# Patient Record
Sex: Male | Born: 1970 | Race: Black or African American | Hispanic: No | Marital: Single | Smoking: Never smoker
Health system: Southern US, Community
[De-identification: ages and names within clinical notes are randomized; demographics above are authoritative.]

---

## 1998-10-15 ENCOUNTER — Emergency Department (HOSPITAL_COMMUNITY): Admission: EM | Admit: 1998-10-15 | Discharge: 1998-10-15 | Payer: Self-pay | Admitting: Emergency Medicine

## 2002-06-03 ENCOUNTER — Emergency Department (HOSPITAL_COMMUNITY): Admission: EM | Admit: 2002-06-03 | Discharge: 2002-06-03 | Payer: Self-pay | Admitting: Emergency Medicine

## 2002-06-19 ENCOUNTER — Emergency Department (HOSPITAL_COMMUNITY): Admission: EM | Admit: 2002-06-19 | Discharge: 2002-06-19 | Payer: Self-pay

## 2005-06-27 ENCOUNTER — Emergency Department (HOSPITAL_COMMUNITY): Admission: EM | Admit: 2005-06-27 | Discharge: 2005-06-27 | Payer: Self-pay | Admitting: Emergency Medicine

## 2009-07-23 ENCOUNTER — Emergency Department (HOSPITAL_COMMUNITY): Admission: EM | Admit: 2009-07-23 | Discharge: 2009-07-23 | Payer: Self-pay | Admitting: Family Medicine

## 2012-05-29 ENCOUNTER — Emergency Department (INDEPENDENT_AMBULATORY_CARE_PROVIDER_SITE_OTHER)
Admission: EM | Admit: 2012-05-29 | Discharge: 2012-05-29 | Disposition: A | Discharge status: Home / Self Care | Payer: Self-pay | Source: Home / Self Care | Attending: Emergency Medicine | Admitting: Emergency Medicine

## 2012-05-29 ENCOUNTER — Encounter (HOSPITAL_COMMUNITY): Payer: Self-pay | Admitting: *Deleted

## 2012-05-29 DIAGNOSIS — T148XXA Other injury of unspecified body region, initial encounter: Secondary | ICD-10-CM

## 2012-05-29 DIAGNOSIS — IMO0002 Reserved for concepts with insufficient information to code with codable children: Secondary | ICD-10-CM

## 2012-05-29 DIAGNOSIS — Z23 Encounter for immunization: Secondary | ICD-10-CM

## 2012-05-29 MED ORDER — BACITRACIN ZINC 500 UNIT/GM EX OINT: TOPICAL_OINTMENT | Freq: Two times a day (BID) | CUTANEOUS | Status: AC

## 2012-05-29 MED ORDER — IBUPROFEN 200 MG PO TABS: 400.0000 mg | ORAL_TABLET | Freq: Four times a day (QID) | ORAL | Status: AC | PRN

## 2012-05-29 MED ORDER — TETANUS-DIPHTH-ACELL PERTUSSIS 5-2.5-18.5 LF-MCG/0.5 IM SUSP
INTRAMUSCULAR | Status: AC
  Filled 2012-05-29: qty 0.5

## 2012-05-29 MED ORDER — TETANUS-DIPHTH-ACELL PERTUSSIS 5-2.5-18.5 LF-MCG/0.5 IM SUSP
0.5000 mL | Freq: Once | INTRAMUSCULAR | Status: AC
  Administered 2012-05-29: 0.5 mL via INTRAMUSCULAR

## 2012-05-29 NOTE — ED Provider Notes (Signed)
History     CSN: [id number]  Arrival date & time 05/29/12  1800   First MD Initiated Contact with Patient 05/29/12 1803      Chief Complaint  Patient presents with  . Extremity Laceration    (Consider location/radiation/quality/duration/timing/severity/associated sxs/prior treatment) HPI Comments: Patient presents urgent care, after having sustained a laceration to his left wrist when he was washing some dishes. Patient bled a bit but has stopped since then it's been approximately 2 hours since the event. Additionally he was washing broke and a fragment injured and hit his left wrist. Patient denies any numbness, tingling sensation or distal weakness to the site of the wound. Vision is also uncertain about the last time he got a tetanus shot.  The history is provided by the patient.    History reviewed. No pertinent past medical history.  History reviewed. No pertinent past surgical history.  History reviewed. No pertinent family history.  History  Substance Use Topics  . Smoking status: Never Smoker   . Smokeless tobacco: Not on file  . Alcohol Use: Yes      Review of Systems  Constitutional: Negative for activity change and appetite change.  Skin: Positive for wound.  Neurological: Negative for dizziness, weakness and numbness.    Allergies  Review of patient's allergies indicates no known allergies.  Home Medications   Current Outpatient Rx  Name Route Sig Dispense Refill  . BACITRACIN ZINC 500 UNIT/GM EX OINT Topical Apply topically 2 (two) times daily. 120 g 0  . IBUPROFEN 200 MG PO TABS Oral Take 2 tablets (400 mg total) by mouth every 6 (six) hours as needed for pain. 30 tablet 0    BP 155/100  Pulse 90  Temp 99.2 F (37.3 C) (Oral)  Resp 19  SpO2 98%  Physical Exam  Nursing note and vitals reviewed. Constitutional: He appears well-developed and well-nourished.  Musculoskeletal: He exhibits tenderness.       Left hand: He exhibits tenderness and  laceration. He exhibits normal range of motion, no bony tenderness, normal two-point discrimination, normal capillary refill, no deformity and no swelling. normal sensation noted. Decreased sensation is not present in the ulnar distribution, is not present in the medial redistribution and is not present in the radial distribution. Normal strength noted. He exhibits no finger abduction, no thumb/finger opposition and no wrist extension trouble.       Hands: Neurological: He is alert.  Skin: No erythema.    ED Course  LACERATION REPAIR Performed by: Brentton Wardlow Authorized by: Jimmie Molly Consent: Verbal consent obtained. Consent given by: patient Patient understanding: patient states understanding of the procedure being performed Patient identity confirmed: verbally with patient Body area: upper extremity Location details: left wrist Laceration length: 2 cm Tendon involvement: none Nerve involvement: none Vascular damage: no Local anesthetic: lidocaine 1% with epinephrine Anesthetic total: 5 ml Patient sedated: no Irrigation solution: saline Skin closure: 4-0 Prolene Technique: simple Approximation: close Approximation difficulty: simple Dressing: antibiotic ointment Patient tolerance: Patient tolerated the procedure well with no immediate complications.   (including critical care time)  Labs Reviewed - No data to display No results found.   1. Laceration       MDM  3 cm laceration to radial aspect of left wrist. On complicated post procedure no active bleeding patient with no neural vascular deficits distally or proximally to the laceration which was repaired with a combination of nonabsorbable sutures and Steri-Strips for  Linear superficial abrasions.  Jimmie Molly, MD 05/29/12 2009

## 2012-05-29 NOTE — ED Notes (Signed)
Laceration left wrist washing dishes glass broke onset approx one hour ago

## 2012-06-07 ENCOUNTER — Encounter (HOSPITAL_COMMUNITY): Payer: Self-pay | Admitting: Emergency Medicine

## 2012-06-07 ENCOUNTER — Emergency Department (INDEPENDENT_AMBULATORY_CARE_PROVIDER_SITE_OTHER)
Admission: EM | Admit: 2012-06-07 | Discharge: 2012-06-07 | Disposition: A | Discharge status: Home / Self Care | Payer: Self-pay | Source: Home / Self Care

## 2012-06-07 DIAGNOSIS — Z4802 Encounter for removal of sutures: Secondary | ICD-10-CM

## 2012-06-07 NOTE — ED Provider Notes (Signed)
Medical screening examination/treatment/procedure(s) were performed by non-physician practitioner and as supervising physician I was immediately available for consultation/collaboration.  Leslee Home, M.D.   Reuben Likes, MD 06/07/12 [phone]

## 2012-06-07 NOTE — ED Notes (Signed)
Pt was seen 05/29/12 at [facility], pt here for suture removal

## 2012-06-07 NOTE — ED Provider Notes (Signed)
History     CSN: [id number]  Arrival date & time 06/07/12  1132   None     Chief Complaint  Patient presents with  . Suture / Staple Removal    (Consider location/radiation/quality/duration/timing/severity/associated sxs/prior treatment) Patient is a 41 y.o. male presenting with suture removal. The history is provided by the patient.  Suture / Staple Removal  The sutures were placed 7 to 10 days ago. There has been no treatment since the wound repair. There has been no drainage from the wound. There is no redness present. There is no swelling present. The pain has no pain. He has no difficulty moving the affected extremity or digit.    History reviewed. No pertinent past medical history.  History reviewed. No pertinent past surgical history.  No family history on file.  History  Substance Use Topics  . Smoking status: Never Smoker   . Smokeless tobacco: Not on file  . Alcohol Use: Yes      Review of Systems  Constitutional: Negative for fever and chills.  Skin: Positive for wound.       Healing well    Allergies  Review of patient's allergies indicates no known allergies.  Home Medications   Current Outpatient Rx  Name Route Sig Dispense Refill  . BACITRACIN ZINC 500 UNIT/GM EX OINT Topical Apply topically 2 (two) times daily. 120 g 0  . IBUPROFEN 200 MG PO TABS Oral Take 2 tablets (400 mg total) by mouth every 6 (six) hours as needed for pain. 30 tablet 0    There were no vitals taken for this visit.  Physical Exam  Constitutional: He appears well-developed and well-nourished. No distress.  Skin: Skin is warm and dry.       2cm wound L anterior wrist healing well, 2 sutures present    ED Course  SUTURE REMOVAL Date/Time: 06/07/2012 12:45 PM Performed by: Cathlyn Parsons Authorized by: Cathlyn Parsons Consent: Verbal consent obtained. Consent given by: patient Patient understanding: patient states understanding of the procedure being performed Body  area: upper extremity Location details: left lower arm Wound Appearance: clean Sutures Removed: 2 Post-removal: Steri-Strips applied Facility: sutures placed in this facility Patient tolerance: Patient tolerated the procedure well with no immediate complications.   (including critical care time)  Labs Reviewed - No data to display No results found.   1. Visit for suture removal       MDM          Cathlyn Parsons, NP 06/07/12 1253

## 2016-08-18 ENCOUNTER — Ambulatory Visit (HOSPITAL_COMMUNITY)
Admission: EM | Admit: 2016-08-18 | Discharge: 2016-08-18 | Disposition: A | Discharge status: Home / Self Care | Payer: Self-pay | Attending: Family Medicine | Admitting: Family Medicine

## 2016-08-18 ENCOUNTER — Encounter (HOSPITAL_COMMUNITY): Payer: Self-pay | Admitting: Emergency Medicine

## 2016-08-18 DIAGNOSIS — Z23 Encounter for immunization: Secondary | ICD-10-CM

## 2016-08-18 DIAGNOSIS — T63444A Toxic effect of venom of bees, undetermined, initial encounter: Secondary | ICD-10-CM

## 2016-08-18 MED ORDER — TETANUS-DIPHTH-ACELL PERTUSSIS 5-2.5-18.5 LF-MCG/0.5 IM SUSP
0.5000 mL | Freq: Once | INTRAMUSCULAR | Status: AC
  Administered 2016-08-18: 0.5 mL via INTRAMUSCULAR

## 2016-08-18 MED ORDER — TETANUS-DIPHTH-ACELL PERTUSSIS 5-2.5-18.5 LF-MCG/0.5 IM SUSP
INTRAMUSCULAR | Status: AC
  Filled 2016-08-18: qty 0.5

## 2016-08-18 NOTE — ED Triage Notes (Signed)
[facility] a [facility] of possible bee sting to his forehead that occurred earlier today. The patient denied any pain but has appreciable edema to the face area.

## 2016-08-18 NOTE — ED Provider Notes (Signed)
CSN: [id number]     Arrival date & time 08/18/16  1730 History   First MD Initiated Contact with Patient 08/18/16 1923     Chief Complaint  Patient presents with  . Insect Bite   (Consider location/radiation/quality/duration/timing/severity/associated sxs/prior Treatment) HPI 45 y/o male stung by bee on forehead today. No reaction. No treatment. States he wants a tetanus shot.  History reviewed. No pertinent past medical history. History reviewed. No pertinent surgical history. History reviewed. No pertinent family history. Social History  Substance Use Topics  . Smoking status: Never Smoker  . Smokeless tobacco: Never Used  . Alcohol use Yes    Review of Systems  Denies: HEADACHE, NAUSEA, ABDOMINAL PAIN, CHEST PAIN, CONGESTION, DYSURIA, SHORTNESS OF BREATH  Allergies  Review of patient's allergies indicates no known allergies.  Home Medications   Prior to Admission medications   Not on File   Meds Ordered and Administered this Visit  Medications - No data to display  BP 149/96 (BP Location: Left Arm)   Pulse 78   Temp 97.7 F (36.5 C) (Oral)   Resp 12   SpO2 100%  No data found.   Physical Exam NURSES NOTES AND VITAL SIGNS REVIEWED. CONSTITUTIONAL: Well developed, well nourished, no acute distress HEENT: normocephalic, atraumatic, without swelling, redness  EYES: Conjunctiva normal NECK:normal ROM, supple, no adenopathy PULMONARY:No respiratory distress, normal effort ABDOMINAL: Soft, ND, NT BS+, No CVAT MUSCULOSKELETAL: Normal ROM of all extremities,  SKIN: warm and dry without rash PSYCHIATRIC: Mood and affect, behavior are normal  Urgent Care Course   Clinical Course    Procedures (including critical care time)  Labs Review Labs Reviewed - No data to display  Imaging Review No results found.   Visual Acuity Review  Right Eye Distance:   Left Eye Distance:   Bilateral Distance:    Right Eye Near:   Left Eye Near:    Bilateral Near:         Tetanus booster provided.  MDM   1. Bee sting, undetermined intent, initial encounter     Patient is reassured that there are no issues that require transfer to higher level of care at this time or additional tests. Patient is advised to continue home symptomatic treatment. Patient is advised that if there are new or worsening symptoms to attend the emergency department, contact primary care provider, or return to UC. Instructions of care provided discharged home in stable condition.    THIS NOTE WAS GENERATED USING A VOICE RECOGNITION SOFTWARE PROGRAM. ALL REASONABLE EFFORTS  WERE MADE TO PROOFREAD THIS DOCUMENT FOR ACCURACY.  I have verbally reviewed the discharge instructions with the patient. A printed AVS was given to the patient.  All questions were answered prior to discharge.      Tharon AquasFrank C Clent Damore, [place] 08/19/16 [phone]

## 2016-08-18 NOTE — Discharge Instructions (Signed)
Apply cold compresses to your forehead this will help with swelling.  Take benadryl which will also help with both itching and swelling.   Return if there are new or worsening of symptoms  If you have shortness of breath call 911

## 2018-05-10 ENCOUNTER — Other Ambulatory Visit: Payer: Self-pay

## 2018-05-10 ENCOUNTER — Encounter (HOSPITAL_COMMUNITY): Payer: Self-pay | Admitting: Emergency Medicine

## 2018-05-10 ENCOUNTER — Emergency Department (HOSPITAL_COMMUNITY)
Admission: EM | Admit: 2018-05-10 | Discharge: 2018-05-10 | Disposition: A | Discharge status: Home / Self Care | Payer: Self-pay | Attending: Emergency Medicine | Admitting: Emergency Medicine

## 2018-05-10 DIAGNOSIS — W208XXA Other cause of strike by thrown, projected or falling object, initial encounter: Secondary | ICD-10-CM | POA: Insufficient documentation

## 2018-05-10 DIAGNOSIS — Y9301 Activity, walking, marching and hiking: Secondary | ICD-10-CM | POA: Insufficient documentation

## 2018-05-10 DIAGNOSIS — S0990XA Unspecified injury of head, initial encounter: Secondary | ICD-10-CM | POA: Insufficient documentation

## 2018-05-10 DIAGNOSIS — Y929 Unspecified place or not applicable: Secondary | ICD-10-CM | POA: Insufficient documentation

## 2018-05-10 DIAGNOSIS — Y999 Unspecified external cause status: Secondary | ICD-10-CM | POA: Insufficient documentation

## 2018-05-10 NOTE — ED Triage Notes (Signed)
Patient to ED to make sure his head is okay after a dead tree branch fell and hit him. Patient states it fell on the anterior side of his forehead, no abrasion, swelling, or hematoma noted. Patient denies LOC. A&O x 4, PERRL.

## 2018-05-10 NOTE — Discharge Instructions (Signed)
Please read attached information. If you experience any new or worsening signs or symptoms please return to the emergency room for evaluation. Please follow-up with your primary care provider or specialist as discussed. Please use medication prescribed only as directed and discontinue taking if you have any concerning signs or symptoms.   °

## 2018-05-10 NOTE — ED Notes (Signed)
Patient verbalized understanding of discharge instructions and denies any further needs or questions at this time. VSS, patient ambulatory with steady gait.

## 2018-05-10 NOTE — ED Provider Notes (Signed)
[facility] EMERGENCY DEPARTMENT Provider Note   CSN: [id number] Arrival date & time: 05/10/18  2134     History   Chief Complaint Chief Complaint  Patient presents with  . Head Injury    HPI Charles Hicks is a 47 y.o. male.  HPI    47 year old male presents status post head injury.  Patient notes he was walking when a tree branch fell and hit him on top of his head.  He notes he was wearing his ball.  He denies any significant head pain, loss of consciousness, neck pain, or any other injuries.  Patient denies any complaints, no swelling to his head, no open wounds.  Patient is not on blood thinners.   History reviewed. No pertinent past medical history.  There are no active problems to display for this patient.   History reviewed. No pertinent surgical history.      Home Medications    Prior to Admission medications   Not on File    Family History No family history on file.  Social History Social History   Tobacco Use  . Smoking status: Never Smoker  . Smokeless tobacco: Never Used  Substance Use Topics  . Alcohol use: Yes  . Drug use: No     Allergies   Patient has no known allergies.   Review of Systems Review of Systems  All other systems reviewed and are negative.    Physical Exam Updated Vital Signs BP (!) 148/101 (BP Location: Right Arm)   Pulse 93   Temp 99.1 F (37.3 C) (Oral)   Resp 16   SpO2 98%   Physical Exam  Constitutional: He is oriented to person, place, and time. He appears well-developed and well-nourished.  HENT:  Head: Normocephalic and atraumatic.  Head is atraumatic no signs of trauma, nontender palpation throughout, no pain with axial compression no C or T-spine tenderness to palpation  Eyes: Pupils are equal, round, and reactive to light. Conjunctivae are normal. Right eye exhibits no discharge. Left eye exhibits no discharge. No scleral icterus.  Neck: Normal range of motion. No JVD  present. No tracheal deviation present.  Pulmonary/Chest: Effort normal. No stridor.  Neurological: He is alert and oriented to person, place, and time. He has normal strength. He is not disoriented. No cranial nerve deficit or sensory deficit. Coordination normal. GCS eye subscore is 4. GCS verbal subscore is 5. GCS motor subscore is 6.  Psychiatric: He has a normal mood and affect. His behavior is normal. Judgment and thought content normal.  Nursing note and vitals reviewed.    ED Treatments / Results  Labs (all labs ordered are listed, but only abnormal results are displayed) Labs Reviewed - No data to display  EKG None  Radiology No results found.  Procedures Procedures (including critical care time)  Medications Ordered in ED Medications - No data to display   Initial Impression / Assessment and Plan / ED Course  I have reviewed the triage vital signs and the nursing notes.  Pertinent labs & imaging results that were available during my care of the patient were reviewed by me and considered in my medical decision making (see chart for details).     Labs:   Imaging:  Consults:  Therapeutics:  Discharge Meds:   Assessment/Plan: 47 year old male presents status post head injury.  Patient denies any physical complaints presently, patient is not on anticoagulation, no neurological deficits, no signs of trauma.  I have very low suspicion for  acute intracranial abnormality.  No need for head CT at this time, patient given strict return precautions, he verbalized understanding and agreement to today's plan had no further questions or concerns.    Final Clinical Impressions(s) / ED Diagnoses   Final diagnoses:  Injury of head, initial encounter    ED Discharge Orders    None       Rosalio LoudHedges, Charles Papesh, PA-C 05/10/18 2157    Tilden Fossaees, Elizabeth, MD 05/11/18 1329

## 2021-02-10 ENCOUNTER — Emergency Department (HOSPITAL_COMMUNITY): Payer: Self-pay | Admitting: Certified Registered Nurse Anesthetist

## 2021-02-10 ENCOUNTER — Emergency Department (HOSPITAL_COMMUNITY): Payer: Self-pay

## 2021-02-10 ENCOUNTER — Encounter (HOSPITAL_COMMUNITY): Payer: Self-pay | Admitting: Emergency Medicine

## 2021-02-10 ENCOUNTER — Inpatient Hospital Stay (HOSPITAL_COMMUNITY)
Admission: EM | Admit: 2021-02-10 | Discharge: 2021-02-13 | DRG: 908 | Disposition: A | Discharge status: Home / Self Care | Payer: Self-pay | Attending: Orthopedic Surgery | Admitting: Orthopedic Surgery

## 2021-02-10 ENCOUNTER — Other Ambulatory Visit: Payer: Self-pay

## 2021-02-10 ENCOUNTER — Encounter (HOSPITAL_COMMUNITY)
Admission: EM | Disposition: A | Discharge status: Home / Self Care | Payer: Self-pay | Source: Home / Self Care | Attending: Orthopedic Surgery

## 2021-02-10 DIAGNOSIS — E559 Vitamin D deficiency, unspecified: Secondary | ICD-10-CM | POA: Diagnosis present

## 2021-02-10 DIAGNOSIS — D62 Acute posthemorrhagic anemia: Secondary | ICD-10-CM

## 2021-02-10 DIAGNOSIS — S85152A Other specified injury of anterior tibial artery, left leg, initial encounter: Secondary | ICD-10-CM

## 2021-02-10 DIAGNOSIS — S8252XB Displaced fracture of medial malleolus of left tibia, initial encounter for open fracture type I or II: Secondary | ICD-10-CM | POA: Diagnosis present

## 2021-02-10 DIAGNOSIS — Z7289 Other problems related to lifestyle: Secondary | ICD-10-CM

## 2021-02-10 DIAGNOSIS — S96092A Other injury of muscle and tendon of long flexor muscle of toe at ankle and foot level, left foot, initial encounter: Secondary | ICD-10-CM | POA: Diagnosis present

## 2021-02-10 DIAGNOSIS — S81812A Laceration without foreign body, left lower leg, initial encounter: Secondary | ICD-10-CM

## 2021-02-10 DIAGNOSIS — Z23 Encounter for immunization: Secondary | ICD-10-CM

## 2021-02-10 DIAGNOSIS — Z789 Other specified health status: Secondary | ICD-10-CM | POA: Diagnosis present

## 2021-02-10 DIAGNOSIS — S85162A Unspecified injury of posterior tibial artery, left leg, initial encounter: Secondary | ICD-10-CM | POA: Diagnosis present

## 2021-02-10 DIAGNOSIS — W293XXA Contact with powered garden and outdoor hand tools and machinery, initial encounter: Secondary | ICD-10-CM | POA: Diagnosis present

## 2021-02-10 DIAGNOSIS — S82892B Other fracture of left lower leg, initial encounter for open fracture type I or II: Secondary | ICD-10-CM | POA: Diagnosis present

## 2021-02-10 DIAGNOSIS — Z20822 Contact with and (suspected) exposure to covid-19: Secondary | ICD-10-CM | POA: Diagnosis present

## 2021-02-10 DIAGNOSIS — S85172A Laceration of posterior tibial artery, left leg, initial encounter: Principal | ICD-10-CM | POA: Diagnosis present

## 2021-02-10 DIAGNOSIS — T1490XA Injury, unspecified, initial encounter: Secondary | ICD-10-CM

## 2021-02-10 DIAGNOSIS — Y9389 Activity, other specified: Secondary | ICD-10-CM

## 2021-02-10 DIAGNOSIS — S81802A Unspecified open wound, left lower leg, initial encounter: Secondary | ICD-10-CM | POA: Diagnosis present

## 2021-02-10 DIAGNOSIS — E8889 Other specified metabolic disorders: Secondary | ICD-10-CM | POA: Diagnosis present

## 2021-02-10 DIAGNOSIS — S96822A Laceration of other specified muscles and tendons at ankle and foot level, left foot, initial encounter: Secondary | ICD-10-CM | POA: Diagnosis present

## 2021-02-10 DIAGNOSIS — Z9889 Other specified postprocedural states: Secondary | ICD-10-CM

## 2021-02-10 DIAGNOSIS — S8402XA Injury of tibial nerve at lower leg level, left leg, initial encounter: Secondary | ICD-10-CM | POA: Diagnosis present

## 2021-02-10 HISTORY — PX: I & D EXTREMITY: SHX5045

## 2021-02-10 LAB — COMPREHENSIVE METABOLIC PANEL
ALT: 23 U/L (ref 0–44)
AST: 42 U/L — ABNORMAL HIGH (ref 15–41)
Albumin: 3.8 g/dL (ref 3.5–5.0)
Alkaline Phosphatase: 36 U/L — ABNORMAL LOW (ref 38–126)
Anion gap: 11 (ref 5–15)
BUN: 15 mg/dL (ref 6–20)
CO2: 23 mmol/L (ref 22–32)
Calcium: 8.9 mg/dL (ref 8.9–10.3)
Chloride: 104 mmol/L (ref 98–111)
Creatinine, Ser: 1.12 mg/dL (ref 0.61–1.24)
GFR, Estimated: >60 mL/min (ref >60)
Glucose, Bld: 134 mg/dL — ABNORMAL HIGH (ref 70–99)
Potassium: 4.3 mmol/L (ref 3.5–5.1)
Sodium: 138 mmol/L (ref 135–145)
Total Bilirubin: 1.1 mg/dL (ref 0.3–1.2)

## 2021-02-10 LAB — I-STAT CHEM 8, ED
BUN: 19 mg/dL (ref 6–20)
Calcium, Ion: 1.1 mmol/L — ABNORMAL LOW (ref 1.15–1.40)
Chloride: 105 mmol/L (ref 98–111)
Creatinine, Ser: 1.2 mg/dL (ref 0.61–1.24)
Glucose, Bld: 133 mg/dL — ABNORMAL HIGH (ref 70–99)
HCT: 37 % — ABNORMAL LOW (ref 39.0–52.0)
Hemoglobin: 12.6 g/dL — ABNORMAL LOW (ref 13.0–17.0)
Potassium: 3.7 mmol/L (ref 3.5–5.1)
Sodium: 140 mmol/L (ref 135–145)
TCO2: 25 mmol/L (ref 22–32)

## 2021-02-10 LAB — CBC
HCT: 37.2 % — ABNORMAL LOW (ref 39.0–52.0)
Hemoglobin: 13 g/dL (ref 13.0–17.0)
MCH: 30.4 pg (ref 26.0–34.0)
MCHC: 34.9 g/dL (ref 30.0–36.0)
MCV: 87.1 fL (ref 80.0–100.0)
Platelets: 224 10*3/uL (ref 150–400)
RBC: 4.27 MIL/uL (ref 4.22–5.81)
RDW: 14.9 % (ref 11.5–15.5)
WBC: 6.7 10*3/uL (ref 4.0–10.5)
nRBC: 0 % (ref 0.0–0.2)

## 2021-02-10 LAB — TYPE AND SCREEN
ABO/RH(D): O POS
Antibody Screen: NEGATIVE

## 2021-02-10 LAB — PROTIME-INR
INR: 1.1 (ref 0.8–1.2)
Prothrombin Time: 13.3 seconds (ref 11.4–15.2)

## 2021-02-10 LAB — RESP PANEL BY RT-PCR (FLU A&B, COVID) ARPGX2
Influenza A by PCR: NEGATIVE
Influenza B by PCR: NEGATIVE
SARS Coronavirus 2 by RT PCR: NEGATIVE

## 2021-02-10 LAB — ABO/RH: ABO/RH(D): O POS

## 2021-02-10 LAB — ETHANOL: Alcohol, Ethyl (B): <10 mg/dL (ref <10)

## 2021-02-10 LAB — PROTEIN, TOTAL: Total Protein: 6.1 g/dL — ABNORMAL LOW (ref 6.5–8.1)

## 2021-02-10 LAB — LACTIC ACID, PLASMA: Lactic Acid, Venous: 2.6 mmol/L (ref 0.5–1.9)

## 2021-02-10 SURGERY — IRRIGATION AND DEBRIDEMENT EXTREMITY
Anesthesia: General | Laterality: Left

## 2021-02-10 MED ORDER — FENTANYL CITRATE (PF) 250 MCG/5ML IJ SOLN
INTRAMUSCULAR | Status: AC
  Filled 2021-02-10: qty 5

## 2021-02-10 MED ORDER — ACETAMINOPHEN 325 MG PO TABS: 325.0000 mg | ORAL_TABLET | Freq: Four times a day (QID) | ORAL | Status: DC | PRN

## 2021-02-10 MED ORDER — POTASSIUM CHLORIDE IN NACL 20-0.9 MEQ/L-% IV SOLN
INTRAVENOUS | Status: DC
  Filled 2021-02-10 (×2): qty 1000

## 2021-02-10 MED ORDER — SODIUM CHLORIDE 0.9 % IV SOLN
2.0000 g | Freq: Every day | INTRAVENOUS | Status: AC
Start: 2021-02-10 — End: 2021-02-13
  Administered 2021-02-11 – 2021-02-12 (×3): 2 g via INTRAVENOUS
  Filled 2021-02-10 (×3): qty 20

## 2021-02-10 MED ORDER — OXYCODONE HCL 5 MG PO TABS
10.0000 mg | ORAL_TABLET | ORAL | Status: DC | PRN
  Administered 2021-02-12: 15 mg via ORAL
  Filled 2021-02-10: qty 3

## 2021-02-10 MED ORDER — METOCLOPRAMIDE HCL 5 MG PO TABS
5.0000 mg | ORAL_TABLET | Freq: Three times a day (TID) | ORAL | Status: DC | PRN
  Administered 2021-02-13: 5 mg via ORAL
  Filled 2021-02-10: qty 1

## 2021-02-10 MED ORDER — ACETAMINOPHEN 500 MG PO TABS
1000.0000 mg | ORAL_TABLET | Freq: Three times a day (TID) | ORAL | Status: DC
  Administered 2021-02-11 – 2021-02-13 (×9): 1000 mg via ORAL
  Filled 2021-02-10 (×8): qty 2

## 2021-02-10 MED ORDER — PHENYLEPHRINE 40 MCG/ML (10ML) SYRINGE FOR IV PUSH (FOR BLOOD PRESSURE SUPPORT)
PREFILLED_SYRINGE | INTRAVENOUS | Status: AC
  Filled 2021-02-10: qty 10

## 2021-02-10 MED ORDER — FENTANYL CITRATE (PF) 100 MCG/2ML IJ SOLN
25.0000 ug | INTRAMUSCULAR | Status: DC | PRN
Start: 2021-02-10 — End: 2021-02-10

## 2021-02-10 MED ORDER — PHENYLEPHRINE 40 MCG/ML (10ML) SYRINGE FOR IV PUSH (FOR BLOOD PRESSURE SUPPORT)
PREFILLED_SYRINGE | INTRAVENOUS | Status: DC | PRN
  Administered 2021-02-10 (×2): 80 ug via INTRAVENOUS

## 2021-02-10 MED ORDER — PROPOFOL 10 MG/ML IV BOLUS
INTRAVENOUS | Status: AC
  Filled 2021-02-10: qty 20

## 2021-02-10 MED ORDER — ROCURONIUM BROMIDE 10 MG/ML (PF) SYRINGE
PREFILLED_SYRINGE | INTRAVENOUS | Status: DC | PRN
  Administered 2021-02-10: 40 mg via INTRAVENOUS

## 2021-02-10 MED ORDER — METOCLOPRAMIDE HCL 5 MG/ML IJ SOLN: 5.0000 mg | Freq: Three times a day (TID) | INTRAMUSCULAR | Status: DC | PRN

## 2021-02-10 MED ORDER — CEFAZOLIN SODIUM-DEXTROSE 2-4 GM/100ML-% IV SOLN
2.0000 g | Freq: Once | INTRAVENOUS | Status: AC
Start: 2021-02-10 — End: 2021-02-10
  Administered 2021-02-10: 2 g via INTRAVENOUS
  Filled 2021-02-10: qty 100

## 2021-02-10 MED ORDER — MIDAZOLAM HCL 2 MG/2ML IJ SOLN
INTRAMUSCULAR | Status: DC | PRN
  Administered 2021-02-10: 2 mg via INTRAVENOUS

## 2021-02-10 MED ORDER — THIAMINE HCL 100 MG/ML IJ SOLN
100.0000 mg | Freq: Every day | INTRAMUSCULAR | Status: DC
  Filled 2021-02-10: qty 2

## 2021-02-10 MED ORDER — ENOXAPARIN SODIUM 40 MG/0.4ML ~~LOC~~ SOLN
40.0000 mg | SUBCUTANEOUS | Status: DC
  Administered 2021-02-11 – 2021-02-13 (×3): 40 mg via SUBCUTANEOUS
  Filled 2021-02-10 (×3): qty 0.4

## 2021-02-10 MED ORDER — SUGAMMADEX SODIUM 200 MG/2ML IV SOLN
INTRAVENOUS | Status: DC | PRN
  Administered 2021-02-10: 200 mg via INTRAVENOUS

## 2021-02-10 MED ORDER — 0.9 % SODIUM CHLORIDE (POUR BTL) OPTIME
TOPICAL | Status: DC | PRN
  Administered 2021-02-10: 1000 mL

## 2021-02-10 MED ORDER — DOCUSATE SODIUM 100 MG PO CAPS
100.0000 mg | ORAL_CAPSULE | Freq: Two times a day (BID) | ORAL | Status: DC
  Administered 2021-02-11 – 2021-02-13 (×5): 100 mg via ORAL
  Filled 2021-02-10 (×5): qty 1

## 2021-02-10 MED ORDER — ALBUMIN HUMAN 5 % IV SOLN: INTRAVENOUS | Status: DC | PRN

## 2021-02-10 MED ORDER — POLYETHYLENE GLYCOL 3350 17 G PO PACK: 17.0000 g | PACK | Freq: Every day | ORAL | Status: DC | PRN

## 2021-02-10 MED ORDER — HYDROMORPHONE HCL 1 MG/ML IJ SOLN
0.5000 mg | INTRAMUSCULAR | Status: DC | PRN
  Filled 2021-02-10: qty 1

## 2021-02-10 MED ORDER — POVIDONE-IODINE 10 % EX SWAB
2.0000 | Freq: Once | CUTANEOUS | Status: DC
Start: 2021-02-10 — End: 2021-02-10

## 2021-02-10 MED ORDER — ONDANSETRON HCL 4 MG/2ML IJ SOLN: 4.0000 mg | Freq: Four times a day (QID) | INTRAMUSCULAR | Status: DC | PRN

## 2021-02-10 MED ORDER — LORAZEPAM 2 MG/ML IJ SOLN: 1.0000 mg | INTRAMUSCULAR | Status: DC | PRN

## 2021-02-10 MED ORDER — METHOCARBAMOL 500 MG PO TABS
750.0000 mg | ORAL_TABLET | Freq: Three times a day (TID) | ORAL | Status: DC
  Administered 2021-02-11 – 2021-02-13 (×9): 750 mg via ORAL
  Filled 2021-02-10 (×8): qty 2

## 2021-02-10 MED ORDER — LIDOCAINE 2% (20 MG/ML) 5 ML SYRINGE
INTRAMUSCULAR | Status: DC | PRN
  Administered 2021-02-10: 60 mg via INTRAVENOUS

## 2021-02-10 MED ORDER — FENTANYL CITRATE (PF) 100 MCG/2ML IJ SOLN
50.0000 ug | Freq: Once | INTRAMUSCULAR | Status: AC
  Administered 2021-02-10: 50 ug via INTRAVENOUS
  Filled 2021-02-10: qty 2

## 2021-02-10 MED ORDER — TETANUS-DIPHTH-ACELL PERTUSSIS 5-2.5-18.5 LF-MCG/0.5 IM SUSY
0.5000 mL | PREFILLED_SYRINGE | Freq: Once | INTRAMUSCULAR | Status: AC
  Administered 2021-02-10: 0.5 mL via INTRAMUSCULAR
  Filled 2021-02-10: qty 0.5

## 2021-02-10 MED ORDER — FOLIC ACID 1 MG PO TABS
1.0000 mg | ORAL_TABLET | Freq: Every day | ORAL | Status: DC
  Administered 2021-02-11 – 2021-02-13 (×3): 1 mg via ORAL
  Filled 2021-02-10 (×3): qty 1

## 2021-02-10 MED ORDER — LACTATED RINGERS IV SOLN: INTRAVENOUS | Status: DC | PRN

## 2021-02-10 MED ORDER — LORAZEPAM 2 MG/ML IJ SOLN: 0.0000 mg | Freq: Two times a day (BID) | INTRAMUSCULAR | Status: DC

## 2021-02-10 MED ORDER — ONDANSETRON HCL 4 MG/2ML IJ SOLN
INTRAMUSCULAR | Status: AC
  Filled 2021-02-10: qty 2

## 2021-02-10 MED ORDER — ROCURONIUM BROMIDE 10 MG/ML (PF) SYRINGE
PREFILLED_SYRINGE | INTRAVENOUS | Status: AC
  Filled 2021-02-10: qty 10

## 2021-02-10 MED ORDER — DEXMEDETOMIDINE (PRECEDEX) IN NS 20 MCG/5ML (4 MCG/ML) IV SYRINGE
PREFILLED_SYRINGE | INTRAVENOUS | Status: AC
  Filled 2021-02-10: qty 5

## 2021-02-10 MED ORDER — METHOCARBAMOL 1000 MG/10ML IJ SOLN: 500.0000 mg | Freq: Three times a day (TID) | INTRAMUSCULAR | Status: DC

## 2021-02-10 MED ORDER — OXYCODONE HCL 5 MG PO TABS
5.0000 mg | ORAL_TABLET | ORAL | Status: DC | PRN
  Administered 2021-02-11 – 2021-02-13 (×4): 10 mg via ORAL
  Filled 2021-02-10 (×4): qty 2

## 2021-02-10 MED ORDER — THIAMINE HCL 100 MG PO TABS
100.0000 mg | ORAL_TABLET | Freq: Every day | ORAL | Status: DC
  Administered 2021-02-11 – 2021-02-13 (×3): 100 mg via ORAL
  Filled 2021-02-10 (×3): qty 1

## 2021-02-10 MED ORDER — PROPOFOL 10 MG/ML IV BOLUS
INTRAVENOUS | Status: DC | PRN
  Administered 2021-02-10: 130 mg via INTRAVENOUS

## 2021-02-10 MED ORDER — CEFAZOLIN SODIUM-DEXTROSE 1-4 GM/50ML-% IV SOLN: 1.0000 g | INTRAVENOUS | Status: DC

## 2021-02-10 MED ORDER — SODIUM CHLORIDE 0.9 % IR SOLN
Status: DC | PRN
  Administered 2021-02-10 (×3): 3000 mL

## 2021-02-10 MED ORDER — MIDAZOLAM HCL 2 MG/2ML IJ SOLN
INTRAMUSCULAR | Status: AC
  Filled 2021-02-10: qty 2

## 2021-02-10 MED ORDER — CHLORHEXIDINE GLUCONATE 4 % EX LIQD: 60.0000 mL | Freq: Once | CUTANEOUS | Status: DC

## 2021-02-10 MED ORDER — DEXAMETHASONE SODIUM PHOSPHATE 10 MG/ML IJ SOLN
INTRAMUSCULAR | Status: DC | PRN
  Administered 2021-02-10: 10 mg via INTRAVENOUS

## 2021-02-10 MED ORDER — ONDANSETRON HCL 4 MG PO TABS: 4.0000 mg | ORAL_TABLET | Freq: Four times a day (QID) | ORAL | Status: DC | PRN

## 2021-02-10 MED ORDER — FENTANYL CITRATE (PF) 250 MCG/5ML IJ SOLN
INTRAMUSCULAR | Status: DC | PRN
  Administered 2021-02-10 (×3): 50 ug via INTRAVENOUS
  Administered 2021-02-10 (×2): 100 ug via INTRAVENOUS
  Administered 2021-02-10: 50 ug via INTRAVENOUS

## 2021-02-10 MED ORDER — CEFAZOLIN SODIUM-DEXTROSE 2-4 GM/100ML-% IV SOLN
2.0000 g | INTRAVENOUS | Status: AC
  Administered 2021-02-10: 2 g via INTRAVENOUS

## 2021-02-10 MED ORDER — ONDANSETRON HCL 4 MG/2ML IJ SOLN
INTRAMUSCULAR | Status: DC | PRN
  Administered 2021-02-10: 4 mg via INTRAVENOUS

## 2021-02-10 MED ORDER — LIDOCAINE 2% (20 MG/ML) 5 ML SYRINGE
INTRAMUSCULAR | Status: AC
  Filled 2021-02-10: qty 5

## 2021-02-10 MED ORDER — LORAZEPAM 1 MG PO TABS: 1.0000 mg | ORAL_TABLET | ORAL | Status: DC | PRN

## 2021-02-10 MED ORDER — LORAZEPAM 2 MG/ML IJ SOLN: 0.0000 mg | Freq: Four times a day (QID) | INTRAMUSCULAR | Status: AC

## 2021-02-10 MED ORDER — METRONIDAZOLE IN NACL 5-0.79 MG/ML-% IV SOLN
500.0000 mg | Freq: Three times a day (TID) | INTRAVENOUS | Status: DC
  Administered 2021-02-11 – 2021-02-13 (×8): 500 mg via INTRAVENOUS
  Filled 2021-02-10 (×8): qty 100

## 2021-02-10 MED ORDER — ADULT MULTIVITAMIN W/MINERALS CH
1.0000 | ORAL_TABLET | Freq: Every day | ORAL | Status: DC
  Administered 2021-02-11 – 2021-02-13 (×3): 1 via ORAL
  Filled 2021-02-10 (×3): qty 1

## 2021-02-10 MED ORDER — DEXAMETHASONE SODIUM PHOSPHATE 10 MG/ML IJ SOLN
INTRAMUSCULAR | Status: AC
  Filled 2021-02-10: qty 1

## 2021-02-10 MED ORDER — SUCCINYLCHOLINE CHLORIDE 200 MG/10ML IV SOSY
PREFILLED_SYRINGE | INTRAVENOUS | Status: DC | PRN
  Administered 2021-02-10: 100 mg via INTRAVENOUS

## 2021-02-10 SURGICAL SUPPLY — 61 items
BNDG COHESIVE 4X5 TAN STRL (GAUZE/BANDAGES/DRESSINGS) ×2 IMPLANT
BNDG ELASTIC 4X5.8 VLCR STR LF (GAUZE/BANDAGES/DRESSINGS) ×1 IMPLANT
BNDG ELASTIC 6X5.8 VLCR STR LF (GAUZE/BANDAGES/DRESSINGS) ×1 IMPLANT
BNDG GAUZE ELAST 4 BULKY (GAUZE/BANDAGES/DRESSINGS) ×4 IMPLANT
BRUSH SCRUB EZ PLAIN DRY (MISCELLANEOUS) ×4 IMPLANT
CANISTER WOUNDNEG PRESSURE 500 (CANNISTER) ×1 IMPLANT
COVER SURGICAL LIGHT HANDLE (MISCELLANEOUS) ×4 IMPLANT
COVER WAND RF STERILE (DRAPES) IMPLANT
CUFF TOURN SGL QUICK 34 (TOURNIQUET CUFF) ×2
CUFF TRNQT CYL 34X4.125X (TOURNIQUET CUFF) IMPLANT
DRAPE U-SHAPE 47X51 STRL (DRAPES) ×2 IMPLANT
DRESSING PREVENA PLUS CUSTOM (GAUZE/BANDAGES/DRESSINGS) IMPLANT
DRSG ADAPTIC 3X8 NADH LF (GAUZE/BANDAGES/DRESSINGS) ×2 IMPLANT
DRSG PREVENA PLUS CUSTOM (GAUZE/BANDAGES/DRESSINGS) ×2
ELECT REM PT RETURN 9FT ADLT (ELECTROSURGICAL)
ELECTRODE REM PT RTRN 9FT ADLT (ELECTROSURGICAL) IMPLANT
GAUZE SPONGE 4X4 12PLY STRL (GAUZE/BANDAGES/DRESSINGS) ×4 IMPLANT
GLOVE BIO SURGEON STRL SZ7.5 (GLOVE) ×3 IMPLANT
GLOVE BIO SURGEON STRL SZ8 (GLOVE) ×3 IMPLANT
GLOVE BIOGEL PI IND STRL 7.5 (GLOVE) ×1 IMPLANT
GLOVE BIOGEL PI IND STRL 9 (GLOVE) ×1 IMPLANT
GLOVE BIOGEL PI INDICATOR 7.5 (GLOVE) ×3
GLOVE BIOGEL PI INDICATOR 9 (GLOVE) ×1
GLOVE SRG 8 PF TXTR STRL LF DI (GLOVE) ×1 IMPLANT
GLOVE SURG UNDER POLY LF SZ8 (GLOVE) ×2
GOWN STRL REUS W/ TWL LRG LVL3 (GOWN DISPOSABLE) ×2 IMPLANT
GOWN STRL REUS W/ TWL XL LVL3 (GOWN DISPOSABLE) ×1 IMPLANT
GOWN STRL REUS W/TWL LRG LVL3 (GOWN DISPOSABLE) ×4
GOWN STRL REUS W/TWL XL LVL3 (GOWN DISPOSABLE) ×2
HANDPIECE INTERPULSE COAX TIP (DISPOSABLE) ×2
KIT BASIN OR (CUSTOM PROCEDURE TRAY) ×2 IMPLANT
KIT DRSG PREVENA PLUS 7DAY 125 (MISCELLANEOUS) ×1 IMPLANT
KIT TURNOVER KIT B (KITS) ×2 IMPLANT
MANIFOLD NEPTUNE II (INSTRUMENTS) ×2 IMPLANT
NS IRRIG 1000ML POUR BTL (IV SOLUTION) ×2 IMPLANT
PACK ORTHO EXTREMITY (CUSTOM PROCEDURE TRAY) ×2 IMPLANT
PAD ABD 8X10 STRL (GAUZE/BANDAGES/DRESSINGS) ×1 IMPLANT
PAD ARMBOARD 7.5X6 YLW CONV (MISCELLANEOUS) ×4 IMPLANT
PAD CAST 4YDX4 CTTN HI CHSV (CAST SUPPLIES) IMPLANT
PADDING CAST ABS 6INX4YD NS (CAST SUPPLIES) ×1
PADDING CAST ABS COTTON 6X4 NS (CAST SUPPLIES) IMPLANT
PADDING CAST COTTON 4X4 STRL (CAST SUPPLIES) ×2
PADDING CAST COTTON 6X4 STRL (CAST SUPPLIES) ×2 IMPLANT
SET HNDPC FAN SPRY TIP SCT (DISPOSABLE) IMPLANT
SOL PREP POV-IOD 4OZ 10% (MISCELLANEOUS) ×2 IMPLANT
SOL PREP PROV IODINE SCRUB 4OZ (MISCELLANEOUS) ×2 IMPLANT
SPONGE LAP 18X18 RF (DISPOSABLE) ×4 IMPLANT
STOCKINETTE IMPERVIOUS 9X36 MD (GAUZE/BANDAGES/DRESSINGS) IMPLANT
SUT PDS AB 1 CT  36 (SUTURE) ×2
SUT PDS AB 1 CT 36 (SUTURE) IMPLANT
SUT PDS AB 2-0 CT1 27 (SUTURE) IMPLANT
SUT PROLENE 0 CT 1 30 (SUTURE) ×1 IMPLANT
SUT PROLENE 2 0 SH DA (SUTURE) ×2 IMPLANT
SUT SILK 0 TIES 10X30 (SUTURE) ×1 IMPLANT
SUT SILK 1 TIES 10X30 (SUTURE) ×1 IMPLANT
TOWEL GREEN STERILE (TOWEL DISPOSABLE) ×4 IMPLANT
TOWEL GREEN STERILE FF (TOWEL DISPOSABLE) ×2 IMPLANT
TUBE CONNECTING 12X1/4 (SUCTIONS) ×2 IMPLANT
UNDERPAD 30X36 HEAVY ABSORB (UNDERPADS AND DIAPERS) ×2 IMPLANT
WATER STERILE IRR 1000ML POUR (IV SOLUTION) ×2 IMPLANT
YANKAUER SUCT BULB TIP NO VENT (SUCTIONS) ×2 IMPLANT

## 2021-02-10 NOTE — ED Notes (Signed)
Md wentz notified of pts lactic of 2.6

## 2021-02-10 NOTE — ED Provider Notes (Signed)
[facility] EMERGENCY DEPARTMENT Provider Note   CSN: [id number] Arrival date & time: 02/10/21  1542     History Chief Complaint  Patient presents with  . Laceration    Charles Hicks is a 50 y.o. male otherwise healthy no daily medication use.  Patient reports around 30 minutes prior to arrival he was cutting a fallen tree with a chainsaw, the chainsaw jumped and struck his left lower leg.  He describes immediate onset severe sharp pain nonradiating worsened with palpation is improved with rest/time.  Patient reports he saw bleeding immediately and attempted to use his jacket to stop the bleeding by tying a knot in it.  His friend then drove him to the emergency room for evaluation.  Patient's pain associated with a tingling sensation to the sole of his left foot.  Denies head injury, loss conscious, neck pain, chest pain, abdominal pain, pain of the upper extremities or right lower leg.   HPI     History reviewed. No pertinent past medical history.  There are no problems to display for this patient.   History reviewed. No pertinent surgical history.     History reviewed. No pertinent family history.  Social History   Tobacco Use  . Smoking status: Never Smoker  . Smokeless tobacco: Never Used  Substance Use Topics  . Alcohol use: Yes  . Drug use: No    Home Medications Prior to Admission medications   Not on File    Allergies    Patient has no known allergies.  Review of Systems   Review of Systems Ten systems are reviewed and are negative for acute change except as noted in the HPI  Physical Exam Updated Vital Signs BP 137/84   Pulse 92   Temp 98.3 F (36.8 C) (Oral)   Resp (!) 22   Ht 5\' 6"  (1.676 m)   Wt 74.8 kg   SpO2 100%   BMI 26.63 kg/m   Physical Exam Constitutional:      General: He is not in acute distress.    Appearance: Normal appearance. He is well-developed. He is not ill-appearing or diaphoretic.  HENT:      Head: Normocephalic and atraumatic.  Eyes:     General: Vision grossly intact. Gaze aligned appropriately.     Pupils: Pupils are equal, round, and reactive to light.  Neck:     Trachea: Trachea and phonation normal.  Cardiovascular:     Pulses:          Dorsalis pedis pulses are 2+ on the right side and 2+ on the left side.  Pulmonary:     Effort: Pulmonary effort is normal. No respiratory distress.  Abdominal:     General: There is no distension.     Palpations: Abdomen is soft.     Tenderness: There is no abdominal tenderness. There is no guarding or rebound.  Musculoskeletal:        General: Normal range of motion.     Cervical back: Normal range of motion.       Legs:     Comments: 2 gaping lacerations medial left ankle.  Proximal laceration approximately 9 cm in length, brisk bleeding present.  Tendon and muscle bodies apparent in wound.  Distal laceration overlying the medial malleolus, small chip appears to be present in the bone.  Feet:     Right foot:     Protective Sensation: 5 sites tested. 5 sites sensed.     Left foot:  Protective Sensation: 5 sites tested. 5 sites sensed.  Skin:    General: Skin is warm and dry.  Neurological:     Mental Status: He is alert.     GCS: GCS eye subscore is 4. GCS verbal subscore is 5. GCS motor subscore is 6.     Comments: Speech is clear and goal oriented, follows commands Major Cranial nerves without deficit, no facial droop Moves extremities without ataxia, coordination intact  Psychiatric:        Behavior: Behavior normal.     ED Results / Procedures / Treatments   Labs (all labs ordered are listed, but only abnormal results are displayed) Labs Reviewed  CBC - Abnormal; Notable for the following components:      Result Value   HCT 37.2 (*)    All other components within normal limits  I-STAT CHEM 8, ED - Abnormal; Notable for the following components:   Glucose, Bld 133 (*)    Calcium, Ion 1.10 (*)    Hemoglobin  12.6 (*)    HCT 37.0 (*)    All other components within normal limits  RESP PANEL BY RT-PCR (FLU A&B, COVID) ARPGX2  ETHANOL  PROTIME-INR  COMPREHENSIVE METABOLIC PANEL  URINALYSIS, ROUTINE W REFLEX MICROSCOPIC  LACTIC ACID, PLASMA  TYPE AND SCREEN  ABO/RH    EKG None  Radiology DG Ankle 2 Views Left  Result Date: 02/10/2021 CLINICAL DATA:  Trauma, chainsaw laceration to the LEFT ankle. EXAM: LEFT ANKLE - 2 VIEW COMPARISON:  None FINDINGS: Soft tissue defect in the medial aspect of the LEFT lower medial extremity overlying the distal tibia. Gas in the soft tissues compatible with reported history of posttraumatic soft tissue injury. No radiopaque foreign body. No signs of fracture. Lateral view shows thickening of the myotendinous junction of the Achilles and also shows gas in the soft tissues. Dressing in place over the site. On the lateral view posterior to the tibia at the site of injury there is a small calcific density approximately 3 mm. Bones and soft tissues are otherwise unremarkable. IMPRESSION: 1. Soft tissue defect with gas in the soft tissues compatible with reported history of posttraumatic soft tissue injury. 2. Question of small bone fragment versus is small foreign body projecting posterior to the tibia on the lateral view. 3. No sign of complete fracture or displacement of the tibia or fibula. 4. Thickening of the myotendinous junction/upper Achilles may simply be related to overlapping soft tissues and edema in the lower extremity. Correlate with site of injury to determine whether they may be risk for Achilles injury. Electronically Signed   By: Donzetta Kohut M.D.   On: 02/10/2021 16:55    Procedures .Critical Care Performed by: Bill Salinas, PA-C Authorized by: Bill Salinas, PA-C   Critical care provider statement:    Critical care time (minutes):  45   Critical care was necessary to treat or prevent imminent or life-threatening deterioration of the  following conditions:  Trauma (Leg trauma due to chainsaw possible limb threatening injury due to vascular involvement.)   Critical care was time spent personally by me on the following activities:  Discussions with consultants, evaluation of patient's response to treatment, examination of patient, ordering and performing treatments and interventions, ordering and review of laboratory studies, ordering and review of radiographic studies, pulse oximetry, re-evaluation of patient's condition, obtaining history from patient or surrogate, review of old charts and development of treatment plan with patient or surrogate     Medications Ordered  in ED Medications  fentaNYL (SUBLIMAZE) injection 50 mcg (50 mcg Intravenous Given 02/10/21 1612)  ceFAZolin (ANCEF) IVPB 2g/100 mL premix (2 g Intravenous New Bag/Given (Non-Interop) 02/10/21 1630)  Tdap (BOOSTRIX) injection 0.5 mL (0.5 mLs Intramuscular Given 02/10/21 1634)    ED Course  I have reviewed the triage vital signs and the nursing notes.  Pertinent labs & imaging results that were available during my care of the patient were reviewed by me and considered in my medical decision making (see chart for details).  Clinical Course as of 02/10/21 1755  Mon Feb 10, 2021  1634 Dr. Randie Heinz [BM]  1636 Dr. Carola Frost [BM]    Clinical Course User Index [BM] Elizabeth Palau   MDM Rules/Calculators/A&P                          Additional history obtained from: 1. Nursing notes from this visit. 2. Electronic medical records. ----------------- 50 year old male arrived with chainsaw injury of the left lower leg and ankle.  Patient had applied a tourniquet with his jacket prior to arrival.  Patient immediately evaluated, DP pulse intact along with sensation capillary refill to the toes.  He endorses some paresthesias to the sole of the foot.  Pain medication was given and patient's jacket was taken off revealing 2 large lacerations.  Large amount of muscle  body present along with vascular structures possibly posterior tibialis.  Patient also appears to have bony involvement of the medial malleolus.  Will obtain trauma labs, x-ray and consult orthopedist and vascular surgeon.  Tdap, pain medication and Ancef ordered.  Patient seen and evaluated by attending physician Dr. Effie Shy. - Patient was seen and evaluated by vascular surgery and orthopedic surgery and is being taken up to the operating room for repair.  Patient was reassessed resting comfortably no acute distress pain controlled following fentanyl.  He states understanding of care plan has no questions at this time vital signs remained stable.  I ordered, reviewed and interpreted labs which include: CBC without leukocytosis, anemia or thrombocytopenia. Covid/influenza panel negative. INR 1.1.  Ethanol negative. Type and screen O+, antibody negative.  DG Left Ankle:  IMPRESSION:  1. Soft tissue defect with gas in the soft tissues compatible with  reported history of posttraumatic soft tissue injury.  2. Question of small bone fragment versus is small foreign body  projecting posterior to the tibia on the lateral view.  3. No sign of complete fracture or displacement of the tibia or  fibula.  4. Thickening of the myotendinous junction/upper Achilles may simply  be related to overlapping soft tissues and edema in the lower  extremity. Correlate with site of injury to determine whether they  may be risk for Achilles injury.     Note: Portions of this report may have been transcribed using voice recognition software. Every effort was made to ensure accuracy; however, inadvertent computerized transcription errors may still be present.  Final Clinical Impression(s) / ED Diagnoses Final diagnoses:  Laceration of left lower extremity, initial encounter  Contact with chainsaw as cause of accidental injury    Rx / DC Orders ED Discharge Orders    None       Elizabeth Palau 02/10/21 1756    Mancel Bale, MD 02/11/21 1026

## 2021-02-10 NOTE — ED Provider Notes (Signed)
  Face-to-face evaluation   History: Patient presents after accidentally injuring his left ankle with a chainsaw, operating while he was cutting a tree that was down.  He presents by private vehicle.  Unknown tetanus status.  He denies other injuries.  He did not try walking after the injury.  Physical exam: Alert, calm, cooperative.  Left medial ankle, with 2 gaping lacerations.  More proximal laceration is about 8 cm in length, gaping about 3 and half centimeters.  There is a large clot at the inferior aspect of this wound.  When this clot was removed there was a visible tendon laceration, and a large blood vessel, with a defect in it, bleeding, with venous type pulsation.  No clear evidence for arterial bleed.  Tibia and macerated muscle tissue was seen at the base of the wound.  Inferior to the larger wound is a small wound about 3 and half centimeters in length, gaping 2 cm wide, with visible tibia at the base, which appears to have a defect in it from the chainsaw blade neurovascular intact distally in the left foot.  Medical screening examination/treatment/procedure(s) were conducted as a shared visit with non-physician practitioner(s) and myself.  I personally evaluated the patient during the encounter    Mancel Bale, MD 02/11/21 1026

## 2021-02-10 NOTE — ED Notes (Signed)
Informed consent obtained and at bedside.

## 2021-02-10 NOTE — H&P (Signed)
Orthopaedic Trauma Service (OTS) Consult/H&P  Patient ID: Charles Hicks MRN: [id number] DOB/AGE: 1970-12-27 49 y.o.   Reason for Consult: Complex wound left leg with exposed tendon due to chainsaw injury Referring Physician: Gray BernhardtElliot Wentz, MD (EDP)   HPI: Charles Hicks is an 50 y.o. male who sustained an accidental injury to his left leg while using a chainsaw.  Patient was cutting down a tree had completed this.  He was on the ground and went to set the chainsaw down fortunately the chain was still moving and he accidentally hit himself in the left ankle.  Patient had immediate onset of pain and a significant amount of blood present.  He was [facility] department for evaluation.  He was found to have an active arterial bleed pressure dressing was applied.  Vascular surgery was consulted and evaluated the patient at bedside.  Posterior tibial artery was lacerated.  At the bedside they were able to find the proximal stump of the artery and this was ligated with 3-0 silk suture.  Distal stump was not observed but there was no active bleeding from the distal stump.  Tendons were clearly visible along with a significant muscle belly injury.  Orthopedics consulted for additional management.  Patient seen and evaluated at the bedside simple dressing applied to the medial aspect of his left leg.  Dressing was removed he has a large irregular laceration to the mid distal third of his lower leg along with a wound over his medial malleolus.  He reports tingling and numbness to the plantar aspect of his foot.  Pain is moderate but not excruciating.  Denies any numbness or tingling to the dorsum or lateral aspect of his left foot.  Denies any additional injuries elsewhere.  No notable past medical history or surgical history Only takes over-the-counter allergy medications  Does not smoke, no other drugs Social drinker  Patient works in Aeronautical engineerlandscaping  Patient received  Ancef and Tdap in the ED  History reviewed. No pertinent past medical history.  History reviewed. No pertinent surgical history.  History reviewed. No pertinent family history.  Social History:  reports that he has never smoked. He has never used smokeless tobacco. He reports current alcohol use. He reports that he does not use drugs.  Allergies: No Known Allergies  Medications: I have reviewed the patient's current medications. No outpatient medications have been marked as taking for the 02/10/21 encounter [facility](Hospital Encounter).     Results for orders placed or performed during the hospital encounter of 02/10/21 (from the past 48 hour(s))  Resp Panel by RT-PCR (Flu A&B, Covid) Nasopharyngeal Swab     Status: None   Collection Time: 02/10/21  4:14 PM   Specimen: Nasopharyngeal Swab; Nasopharyngeal(NP) swabs in vial transport medium  Result Value Ref Range   SARS Coronavirus 2 by RT PCR NEGATIVE NEGATIVE    Comment: (NOTE) SARS-CoV-2 target nucleic acids are NOT DETECTED.  The SARS-CoV-2 RNA is generally detectable in upper respiratory specimens during the acute phase of infection. The lowest concentration of SARS-CoV-2 viral copies this assay can detect is 138 copies/mL. A negative result does not preclude SARS-Cov-2 infection and should not be used as the sole basis for treatment or other patient management decisions. A negative result may occur with  improper specimen collection/handling, submission of specimen other than nasopharyngeal swab, presence of viral mutation(s) within the areas targeted by this assay, and inadequate number of viral copies(<138 copies/mL). A negative  result must be combined with clinical observations, patient history, and epidemiological information. The expected result is Negative.  Fact Sheet for Patients:  BloggerCourse.com  Fact Sheet for Healthcare Providers:  SeriousBroker.it  This test is  no t yet approved or cleared by the [place] FDA and  has been authorized for detection and/or diagnosis of SARS-CoV-2 by FDA under an Emergency Use Authorization (EUA). This EUA will remain  in effect (meaning this test can be used) for the duration of the COVID-19 declaration under Section 564(b)(1) of the Act, 21 U.S.C.section 360bbb-3(b)(1), unless the authorization is terminated  or revoked sooner.       Influenza A by PCR NEGATIVE NEGATIVE   Influenza B by PCR NEGATIVE NEGATIVE    Comment: (NOTE) The Xpert Xpress SARS-CoV-2/FLU/RSV plus assay is intended as an aid in the diagnosis of influenza from Nasopharyngeal swab specimens and should not be used as a sole basis for treatment. Nasal washings and aspirates are unacceptable for Xpert Xpress SARS-CoV-2/FLU/RSV testing.  Fact Sheet for Patients: BloggerCourse.com  Fact Sheet for Healthcare Providers: SeriousBroker.it  This test is not yet approved or cleared by the [place] FDA and has been authorized for detection and/or diagnosis of SARS-CoV-2 by FDA under an Emergency Use Authorization (EUA). This EUA will remain in effect (meaning this test can be used) for the duration of the COVID-19 declaration under Section 564(b)(1) of the Act, 21 U.S.C. section 360bbb-3(b)(1), unless the authorization is terminated or revoked.  Performed at [facility], [address], [place], [place] [address]   CBC     Status: Abnormal   Collection Time: 02/10/21  4:14 PM  Result Value Ref Range   WBC 6.7 4.0 - 10.5 K/uL   RBC 4.27 4.22 - 5.81 MIL/uL   Hemoglobin 13.0 13.0 - 17.0 g/dL   HCT 62.3 (L) 76.2 - 83.1 %   MCV 87.1 80.0 - 100.0 fL   MCH 30.4 26.0 - 34.0 pg   MCHC 34.9 30.0 - 36.0 g/dL   RDW 51.7 61.6 - 07.3 %   Platelets 224 150 - 400 K/uL   nRBC 0.0 0.0 - 0.2 %    Comment: Performed at [facility], [address], [place], [place] [address]  Ethanol      Status: None   Collection Time: 02/10/21  4:14 PM  Result Value Ref Range   Alcohol, Ethyl (B) <10 <10 mg/dL    Comment: (NOTE) Lowest detectable limit for serum alcohol is 10 mg/dL.  For medical purposes only. Performed at [facility], [address] [place], [place] [address]   Protime-INR     Status: None   Collection Time: 02/10/21  4:14 PM  Result Value Ref Range   Prothrombin Time 13.3 11.4 - 15.2 seconds   INR 1.1 0.8 - 1.2    Comment: (NOTE) INR goal varies based on device and disease states. Performed at [facility], [address], [place], [place] [address]   Type and screen [facility]     Status: None   Collection Time: 02/10/21  4:15 PM  Result Value Ref Range   ABO/RH(D) O POS    Antibody Screen NEG    Sample Expiration      02/13/2021,2359 Performed at [facility], [address] [place], [place] [address]   I-Stat Chem 8, ED     Status: Abnormal   Collection Time: 02/10/21  4:40 PM  Result Value  Ref Range   Sodium 140 135 - 145 mmol/L   Potassium 3.7 3.5 - 5.1 mmol/L   Chloride 105 98 - 111 mmol/L   BUN 19 6 - 20 mg/dL   Creatinine, Ser 5.40 0.61 - 1.24 mg/dL   Glucose, Bld 086 (H) 70 - 99 mg/dL    Comment: Glucose reference range applies only to samples taken after fasting for at least 8 hours.   Calcium, Ion 1.10 (L) 1.15 - 1.40 mmol/L   TCO2 25 22 - 32 mmol/L   Hemoglobin 12.6 (L) 13.0 - 17.0 g/dL   HCT 76.1 (L) 95.0 - 93.2 %    DG Ankle 2 Views Left  Result Date: 02/10/2021 CLINICAL DATA:  Trauma, chainsaw laceration to the LEFT ankle. EXAM: LEFT ANKLE - 2 VIEW COMPARISON:  None FINDINGS: Soft tissue defect in the medial aspect of the LEFT lower medial extremity overlying the distal tibia. Gas in the soft tissues compatible with reported history of posttraumatic soft tissue injury. No radiopaque foreign body. No signs of fracture. Lateral view shows thickening of the myotendinous junction of the Achilles  and also shows gas in the soft tissues. Dressing in place over the site. On the lateral view posterior to the tibia at the site of injury there is a small calcific density approximately 3 mm. Bones and soft tissues are otherwise unremarkable. IMPRESSION: 1. Soft tissue defect with gas in the soft tissues compatible with reported history of posttraumatic soft tissue injury. 2. Question of small bone fragment versus is small foreign body projecting posterior to the tibia on the lateral view. 3. No sign of complete fracture or displacement of the tibia or fibula. 4. Thickening of the myotendinous junction/upper Achilles may simply be related to overlapping soft tissues and edema in the lower extremity. Correlate with site of injury to determine whether they may be risk for Achilles injury. Electronically Signed   By: Donzetta Kohut M.D.   On: 02/10/2021 16:55    Intake/Output    None      Review of Systems  Constitutional: Negative for chills and fever.  HENT: Negative for hearing loss.   Eyes: Negative for blurred vision and double vision.  Respiratory: Negative for shortness of breath and wheezing.   Cardiovascular: Negative for chest pain and palpitations.  Gastrointestinal: Negative for abdominal pain, nausea and vomiting.  Genitourinary: Negative for dysuria.  Musculoskeletal:       Left lower leg pain  Neurological: Positive for tingling (Tingling plantar aspect left foot) and sensory change (Numbness plantar aspect left foot). Negative for dizziness and headaches.  Endo/Heme/Allergies: Positive for environmental allergies.  Psychiatric/Behavioral: Negative for substance abuse.   Blood pressure 137/84, pulse 92, temperature 98.3 F (36.8 C), temperature source Oral, resp. rate (!) 22, height 5\' 6"  (1.676 m), weight 74.8 kg, SpO2 100 %. Physical Exam Vitals and nursing note reviewed. Exam conducted with a chaperone present.  Constitutional:      General: He is not in acute distress.     Appearance: Normal appearance. He is normal weight.  HENT:     Head: Normocephalic and atraumatic.     Mouth/Throat:     Mouth: Mucous membranes are moist.     Pharynx: Oropharynx is clear.  Eyes:     Extraocular Movements: Extraocular movements intact.  Cardiovascular:     Rate and Rhythm: Normal rate and regular rhythm.     Pulses: Normal pulses.     Heart sounds: Normal heart sounds. No murmur heard.  Pulmonary:     Effort: Pulmonary effort is normal. No respiratory distress.     Breath sounds: Normal breath sounds. No wheezing.  Abdominal:     General: Bowel sounds are normal.     Palpations: Abdomen is soft.  Musculoskeletal:     Cervical back: Normal range of motion. No tenderness.     Comments: Left lower extremity Traumatic wound to the mid distal portion of the medial left leg approximately 8 to 10 cm in length, edges are irregular.  Exposed muscle belly present tied off pulsatile artery noted no active bleeding noted.  Not appreciate any bleeding distally.  No active oozing.  I did not probe the wound I do not appreciate a significant amount of dirt or debris.  No clothing appreciated in the wound on gross inspection Approximately 3 cm wound noted over the medial malleolus.  Tendons are visible.  They do have a black appearance to them.  The retinaculum appears to be severed as well.  I do believe I am visualizing the posterior tibialis tendon as well as the flexor digitorum longus tendon.  I do not appreciate any other structures but did not probe deep.  Skin is quite friable over the medial malleolus.  Do not appreciate any obvious violation of the periosteum  SPN and TN sensory functions intact TN sensation nearly absent on the plantar aspect of his foot  Patient is able to flex and extend his ankle, inversion is weak.  Eversion is intact He can perform big toe extension as well as big toe flexion Very weak lesser toe flexion did Lesser toe extension is intact  + DP  pulse  Compartments are soft lower leg and the foot  Knee and hip are unremarkable   Neurological:     Mental Status: He is alert and oriented to person, place, and time.  Psychiatric:        Mood and Affect: Mood normal.        Behavior: Behavior normal.        Thought Content: Thought content normal.      Assessment/Plan:  50 year old male accidental chainsaw injury to left leg with complex wound including tendon and vascular injury  -Complex wound left lower leg with tendon and vascular involvement, no fractures identified  Posterior tibial artery was ligated with respect to the proximal limb at the bedside by vascular surgery.  We will explore in the OR to evaluate for the distal limb and cauterized.  Patient does appear to have injury to his posterior tibial nerve as well as his flexor digitorum longus.  Based off of the wounds in my evaluation I do feel that it is more of a muscle belly type injury and may not be repairable.  We will perform irrigation debridement of his wound closure.  It is possible that after appropriate debridement he lacks sufficient soft tissue we may need to get plastics involved.   Will place him on Rocephin postoperatively given the mechanism of his injury x 72 hours     His weightbearing status will be dependent on what is repaired in the OR.  If we do proceed with tendon repair will likely place him in a splint and have him nonweightbearing for about 4weeks.  If there are no structures repaired in terms of tendinous structures he will likely be WBAT in CAM     - Pain management:  Titrate accordingly - ABL anemia/Hemodynamics  Monitor - Medical issues   No chronic issues - DVT/PE  prophylaxis:  Will likely place on aspirin 325 mg twice daily for 4 weeks  - ID:   Has received Ancef  Rocephin x72 hours postoperatively  - FEN/GI prophylaxis/Foley/Lines:  Npo  - Dispo:  OR today for irrigation debridement and complex wounds left leg due to  chainsaw injury    Mearl Latin, PA-C [phone] (C) 02/10/2021, 6:00 PM  [facility] [address] [place] [address] [phone] [phone] (F)    After 5pm and on the weekends please log on to Amion, go to orthopaedics and the look under the [facility] Call for the provider(s) on call. You can also call our office at [phone] and then follow the prompts to be connected to the call team.

## 2021-02-10 NOTE — Consult Note (Addendum)
Hospital Consult    Reason for Consult:  Chainsaw injury to left leg Requesting Physician:  Dr.Wentz MRN #:  [id number]  History of Present Illness: This is a 50 y.o. male with no significant past medical history who presented to ED after a self inflicted injury to his left leg with a chainsaw that occurred while cutting down a tree. He had applied a tourniquet himself prior to arrival. He does have some pain in left leg but he can move all his toes and ankle. He does have some numbness bottom of left foot near heel, but otherwise reports normal sensation to rest of foot and leg. He denies any other injuries  History reviewed. No pertinent past medical history.  History reviewed. No pertinent surgical history.  No Known Allergies  Prior to Admission medications   Not on File    Social History   Socioeconomic History  . Marital status: Single    Spouse name: Not on file  . Number of children: Not on file  . Years of education: Not on file  . Highest education level: Not on file  Occupational History  . Not on file  Tobacco Use  . Smoking status: Never Smoker  . Smokeless tobacco: Never Used  Substance and Sexual Activity  . Alcohol use: Yes  . Drug use: No  . Sexual activity: Not on file  Other Topics Concern  . Not on file  Social History Narrative  . Not on file   Social Determinants of Health   Financial Resource Strain: Not on file  Food Insecurity: Not on file  Transportation Needs: Not on file  Physical Activity: Not on file  Stress: Not on file  Social Connections: Not on file  Intimate Partner Violence: Not on file     No family history on file.  ROS: Otherwise negative unless mentioned in HPI  Physical Examination  Vitals:   02/10/21 1615 02/10/21 1630  BP: 118/78 116/83  Pulse: 95 85  Resp: 19 19  Temp:    SpO2: 99% 100%   Body mass index is 26.63 kg/m.  General:  WDWN in NAD Gait: Not observed HENT: WNL, normocephalic Pulmonary:  normal non-labored breathing Cardiac: regular rate and rhythm Vascular Exam/Pulses: bilateral lower extremities well perfused and warm. Left DP pulse palpable. Left PT pulse not palpable.Motor and sensation intact with a little decreased sensation on the plantar heel region of left foot. On removal of compressive dressings there were two lacerations to the medial distal left leg, one proximal to the medial malleolus about 4 cm in length, the second overlying the medial malleolus approximately 3 cm in in length. There was visible lacerated tendon, macerated muscle and the posterior tibial artery was visible within the wound bed spurting blood of the distal aspect of the more superior laceration. The posterior tibial artery was clamped and subsequently suture ligated with 3-0 Silk suture. The rest of the left leg and foot were unremarkable Right foot unremarkable with palpable DP and PT pulses. Motor and sensation intact Musculoskeletal: no muscle wasting or atrophy  Neurologic: A&O X 3;  No focal weakness or paresthesias are detected; speech is fluent/normal Psychiatric:  The pt has Normal affect.   CBC    Component Value Date/Time   HGB 12.6 (L) 02/10/2021 1640   HCT 37.0 (L) 02/10/2021 1640    BMET    Component Value Date/Time   NA 140 02/10/2021 1640   K 3.7 02/10/2021 1640   CL 105 02/10/2021 1640  GLUCOSE 133 (H) 02/10/2021 1640   BUN 19 02/10/2021 1640   CREATININE 1.20 02/10/2021 1640    COAGS: No results found for: INR, PROTIME   Non-Invasive Vascular Imaging:   DG Ankle 2 Views Left: IMPRESSION: 1. Soft tissue defect with gas in the soft tissues compatible with reported history of posttraumatic soft tissue injury. 2. Question of small bone fragment versus is small foreign body projecting posterior to the tibia on the lateral view. 3. No sign of complete fracture or displacement of the tibia or fibula. 4. Thickening of the myotendinous junction/upper Achilles may simply  be related to overlapping soft tissues and edema in the lower extremity. Correlate with site of injury to determine whether they may be risk for Achilles injury.  Statin:  No. Beta Blocker:  No. Aspirin:  No. ACEI:  No. ARB:  No. CCB use:  No Other antiplatelets/anticoagulants:  No.    ASSESSMENT/PLAN: This is a 50 y.o. male with no significant past medical history who presented to ED after a self inflicted injury to his left leg with a chainsaw that occurred while cutting down a tree. In the ER his left foot was warm, well perfused with palpable DP pulse. No palpable Posterior tibial pulse was felt. Motor and sensation intact with a little decreased sensation on the plantar heel region of left foot. On removal of compressive dressings the posterior tibial artery was visible in the wound bed spurting blood. The posterior tibial artery was clamped and subsequently suture ligated with 5-0 Prolene. Further management by orthopedics for soft tissue and bony injuries. Vascular will remain available if needed  Dory Horn [facility] [phone] 02/10/2021  4:54 PM   I have independently interviewed and examined patient and agree with PA assessment and plan above.  As above.  Has bounding dorsalis pedis pulse after ligation of left lower extremity posterior tibial artery.  No further intervention necessary from vascular standpoint.  Brandon C. Randie Heinz, MD [facility] [place] Office: [phone] Pager: [phone]

## 2021-02-10 NOTE — Transfer of Care (Signed)
Immediate Anesthesia Transfer of Care Note  Patient: Charles Hicks  Procedure(s) Performed: IRRIGATION AND DEBRIDEMENT ANKLE (Left )  Patient Location: PACU  Anesthesia Type:General  Level of Consciousness: drowsy  Airway & Oxygen Therapy: Patient Spontanous Breathing and Patient connected to face mask oxygen  Post-op Assessment: Report given to RN and Post -op Vital signs reviewed and stable  Post vital signs: Reviewed and stable  Last Vitals:  Vitals Value Taken Time  BP    Temp    Pulse    Resp    SpO2      Last Pain:  Vitals:   02/10/21 1856  TempSrc:   PainSc: 5          Complications: No complications documented.

## 2021-02-10 NOTE — Brief Op Note (Signed)
#   [id number] 

## 2021-02-10 NOTE — ED Triage Notes (Signed)
Patient cutting a tree on the ground with a chainsaw. Cut tree and chainsaw went through tree and hit left foot two location. Self tourniquet prior to arrival. Pedal pulse +2 able to move all toes and states bottom of left foot is numb.

## 2021-02-10 NOTE — Progress Notes (Signed)
Received phone call from lab saying potassium has hemolyzed. Per Dr. Carola Frost not needed to be redrawn before surgery. No new orders received.

## 2021-02-10 NOTE — Anesthesia Preprocedure Evaluation (Signed)
Anesthesia Evaluation  Patient identified by MRN, date of birth, ID band Patient awake    Reviewed: Allergy & Precautions, NPO status , Patient's Chart, lab work & pertinent test results  Airway Mallampati: II  TM Distance: >3 FB Neck ROM: Full    Dental  (+) Dental Advisory Given   Pulmonary neg pulmonary ROS,    breath sounds clear to auscultation       Cardiovascular negative cardio ROS   Rhythm:Regular Rate:Normal     Neuro/Psych negative neurological ROS     GI/Hepatic negative GI ROS, Neg liver ROS,   Endo/Other  negative endocrine ROS  Renal/GU negative Renal ROS     Musculoskeletal   Abdominal   Peds  Hematology negative hematology ROS (+)   Anesthesia Other Findings   Reproductive/Obstetrics                             Anesthesia Physical Anesthesia Plan  ASA: II and emergent  Anesthesia Plan: General   Post-op Pain Management:    Induction: Intravenous and Rapid sequence  PONV Risk Score and Plan: 2 and Ondansetron, Dexamethasone and Treatment may vary due to age or medical condition  Airway Management Planned: Oral ETT  Additional Equipment: None  Intra-op Plan:   Post-operative Plan: Extubation in OR  Informed Consent: I have reviewed the patients History and Physical, chart, labs and discussed the procedure including the risks, benefits and alternatives for the proposed anesthesia with the patient or authorized representative who has indicated his/her understanding and acceptance.     Dental advisory given  Plan Discussed with: CRNA  Anesthesia Plan Comments:         Anesthesia Quick Evaluation

## 2021-02-10 NOTE — Anesthesia Procedure Notes (Signed)
Procedure Name: Intubation Date/Time: 02/10/2021 8:18 PM Performed by: Molli Hazard, CRNA Pre-anesthesia Checklist: Patient identified, Emergency Drugs available, Suction available and Patient being monitored Patient Re-evaluated:Patient Re-evaluated prior to induction Oxygen Delivery Method: Circle system utilized Preoxygenation: Pre-oxygenation with 100% oxygen Induction Type: IV induction, Rapid sequence and Cricoid Pressure applied Laryngoscope Size: Miller and 2 Grade View: Grade I Tube type: Oral Tube size: 7.5 mm Number of attempts: 1 Airway Equipment and Method: Stylet Placement Confirmation: ETT inserted through vocal cords under direct vision,  positive ETCO2 and breath sounds checked- equal and bilateral Secured at: 23 cm Tube secured with: Tape Dental Injury: Teeth and Oropharynx as per pre-operative assessment

## 2021-02-11 ENCOUNTER — Encounter (HOSPITAL_COMMUNITY): Payer: Self-pay | Admitting: Orthopedic Surgery

## 2021-02-11 LAB — COMPREHENSIVE METABOLIC PANEL
ALT: 19 U/L (ref 0–44)
AST: 28 U/L (ref 15–41)
Albumin: 3.5 g/dL (ref 3.5–5.0)
Alkaline Phosphatase: 24 U/L — ABNORMAL LOW (ref 38–126)
Anion gap: 7 (ref 5–15)
BUN: 8 mg/dL (ref 6–20)
CO2: 26 mmol/L (ref 22–32)
Calcium: 8.3 mg/dL — ABNORMAL LOW (ref 8.9–10.3)
Chloride: 104 mmol/L (ref 98–111)
Creatinine, Ser: 0.88 mg/dL (ref 0.61–1.24)
GFR, Estimated: >60 mL/min (ref >60)
Glucose, Bld: 155 mg/dL — ABNORMAL HIGH (ref 70–99)
Potassium: 4.2 mmol/L (ref 3.5–5.1)
Sodium: 137 mmol/L (ref 135–145)
Total Bilirubin: 0.9 mg/dL (ref 0.3–1.2)
Total Protein: 5.9 g/dL — ABNORMAL LOW (ref 6.5–8.1)

## 2021-02-11 LAB — URINALYSIS, ROUTINE W REFLEX MICROSCOPIC
Bilirubin Urine: NEGATIVE
Glucose, UA: NEGATIVE mg/dL
Hgb urine dipstick: NEGATIVE
Ketones, ur: NEGATIVE mg/dL
Leukocytes,Ua: NEGATIVE
Nitrite: NEGATIVE
Protein, ur: NEGATIVE mg/dL
Specific Gravity, Urine: 1.011 (ref 1.005–1.030)
pH: 5 (ref 5.0–8.0)

## 2021-02-11 LAB — CBC
HCT: 27.1 % — ABNORMAL LOW (ref 39.0–52.0)
Hemoglobin: 9.2 g/dL — ABNORMAL LOW (ref 13.0–17.0)
MCH: 30 pg (ref 26.0–34.0)
MCHC: 33.9 g/dL (ref 30.0–36.0)
MCV: 88.3 fL (ref 80.0–100.0)
Platelets: 156 10*3/uL (ref 150–400)
RBC: 3.07 MIL/uL — ABNORMAL LOW (ref 4.22–5.81)
RDW: 15 % (ref 11.5–15.5)
WBC: 10.1 10*3/uL (ref 4.0–10.5)
nRBC: 0 % (ref 0.0–0.2)

## 2021-02-11 LAB — VITAMIN D 25 HYDROXY (VIT D DEFICIENCY, FRACTURES): Vit D, 25-Hydroxy: 12.81 ng/mL — ABNORMAL LOW (ref 30–100)

## 2021-02-11 LAB — MAGNESIUM: Magnesium: 1.5 mg/dL — ABNORMAL LOW (ref 1.7–2.4)

## 2021-02-11 LAB — PHOSPHORUS: Phosphorus: 3.1 mg/dL (ref 2.5–4.6)

## 2021-02-11 MED ORDER — MAGNESIUM OXIDE 400 (241.3 MG) MG PO TABS
400.0000 mg | ORAL_TABLET | Freq: Two times a day (BID) | ORAL | Status: AC
  Administered 2021-02-11 (×2): 400 mg via ORAL
  Filled 2021-02-11 (×2): qty 1

## 2021-02-11 MED ORDER — ASCORBIC ACID 500 MG PO TABS
500.0000 mg | ORAL_TABLET | Freq: Every day | ORAL | Status: DC
  Administered 2021-02-11 – 2021-02-13 (×3): 500 mg via ORAL
  Filled 2021-02-11 (×3): qty 1

## 2021-02-11 MED ORDER — VITAMIN D 25 MCG (1000 UNIT) PO TABS
2000.0000 [IU] | ORAL_TABLET | Freq: Two times a day (BID) | ORAL | Status: DC
  Administered 2021-02-11 – 2021-02-13 (×5): 2000 [IU] via ORAL
  Filled 2021-02-11 (×5): qty 2

## 2021-02-11 NOTE — Evaluation (Signed)
Occupational Therapy Evaluation Patient Details Name: Charles Hicks MRN: [id number] DOB: 1971-04-26 Today's Date: 02/11/2021    History of Present Illness Ramell Wacha is 50 year old male who sustained an accidental chainsaw injury to left distal medial leg/ankle with complex wound including tendon, nerve and vascular injury. Pt appears to have injury to his posterior tibial nerve as well as his flexor digitorum longus, posterior tibial artery was ligated at bedside by vascular surgery. Pt underwent and I&D of L ankle and was placed in a splint on 3/21 and will be NWBx4 weeks. PMH: unremarkable.   Clinical Impression   Pt presents with decline in function and safety with ADLs and ADL mobility with impaired balance and endurance. PTA, pt lived at home withhis wife and adult son, was Ind with ADLs, mobility, was driving and working full time. Pt currently requires min guard A with LB ADLs, ADLs standing at sink and mobility using RW. Pt able to maintain L EL NWB. Pt would benefit from acute OT services to address impairments to maximize level of function and safety    Follow Up Recommendations  No OT follow up;Supervision - Intermittent    Equipment Recommendations  Other (comment) (reacher)    Recommendations for Other Services       Precautions / Restrictions Precautions Precautions: Fall Precaution Comments: L LE wound vac, splint with ace wrap Restrictions Weight Bearing Restrictions: Yes LLE Weight Bearing: Non weight bearing      Mobility Bed Mobility   Bed Mobility: Supine to Sit           General bed mobility comments: pt in recliner upon arrival, up earlier with PT    Transfers Overall transfer level: Needs assistance Equipment used: Rolling walker (2 wheeled) Transfers: Sit to/from Stand Sit to Stand: Min guard         General transfer comment: min guard for safety, verbal cues for hand placement, good technique    Balance Overall balance  assessment: Needs assistance Sitting-balance support: Feet supported;No upper extremity supported Sitting balance-Leahy Scale: Good     Standing balance support: Bilateral upper extremity supported;During functional activity Standing balance-Leahy Scale: Poor                             ADL either performed or assessed with clinical judgement   ADL Overall ADL's : Needs assistance/impaired Eating/Feeding: Independent;Sitting   Grooming: Wash/dry hands;Wash/dry face;Oral care;Min guard;Standing   Upper Body Bathing: Set up;Sitting   Lower Body Bathing: Min guard;Sitting/lateral leans;Sit to/from stand   Upper Body Dressing : Set up;Sitting   Lower Body Dressing: Min guard;Sitting/lateral leans;Sit to/from stand   Toilet Transfer: Min guard;Ambulation;RW;Comfort height toilet;Grab bars;Cueing for safety   Toileting- Clothing Manipulation and Hygiene: Min guard;Sit to/from stand       Functional mobility during ADLs: Min guard;Rolling walker;Cueing for safety General ADL Comments: pt educated on compensatory LB ADL techniques     Vision Patient Visual Report: No change from baseline       Perception     Praxis      Pertinent Vitals/Pain Pain Assessment: 0-10 Pain Score: 2  Pain Descriptors / Indicators: Numbness Pain Intervention(s): Monitored during session;Repositioned     Hand Dominance Right   Extremity/Trunk Assessment Upper Extremity Assessment Upper Extremity Assessment: Overall WFL for tasks assessed   Lower Extremity Assessment Lower Extremity Assessment: Defer to PT evaluation   Cervical / Trunk Assessment Cervical / Trunk Assessment: Normal   Communication Communication  Communication: No difficulties   Cognition Arousal/Alertness: Awake/alert Behavior During Therapy: WFL for tasks assessed/performed Overall Cognitive Status: Within Functional Limits for tasks assessed                                     General  Comments       Exercises     Shoulder Instructions      Home Living Family/patient expects to be discharged to:: Private residence Living Arrangements: Spouse/significant other;Children Available Help at Discharge: Family;Available 24 hours/day Type of Home: House   Entrance Stairs-Number of Steps: 5 Entrance Stairs-Rails: Can reach both Home Layout: One level     Bathroom Shower/Tub: Chief Strategy Officer: Handicapped height     Home Equipment: Environmental consultant - 2 wheels          Prior Functioning/Environment Level of Independence: Independent        Comments: works in Product/process development scientist Problem List: Impaired balance (sitting and/or standing);Pain;Decreased coordination;Decreased activity tolerance;Decreased knowledge of use of DME or AE      OT Treatment/Interventions: DME and/or AE instruction;Self-care/ADL training;Therapeutic activities;Balance training;Patient/family education    OT Goals(Current goals can be found in the care plan section) Acute Rehab OT Goals Patient Stated Goal: go home OT Goal Formulation: With patient Time For Goal Achievement: 02/25/21 Potential to Achieve Goals: Good ADL Goals Pt Will Perform Grooming: with supervision;with set-up;with modified independence;standing Pt Will Perform Lower Body Bathing: with supervision;with set-up;with modified independence;sitting/lateral leans;sit to/from stand Pt Will Perform Lower Body Dressing: with supervision;with set-up;with modified independence;sitting/lateral leans;sit to/from stand Pt Will Transfer to Toilet: with supervision;with modified independence;ambulating Pt Will Perform Toileting - Clothing Manipulation and hygiene: with supervision;with modified independence;sit to/from stand  OT Frequency: Min 2X/week   Barriers to D/C:            Co-evaluation              AM-PAC OT "6 Clicks" Daily Activity     Outcome Measure Help from another person eating meals?:  None Help from another person taking care of personal grooming?: A Little Help from another person toileting, which includes using toliet, bedpan, or urinal?: A Little Help from another person bathing (including washing, rinsing, drying)?: A Little Help from another person to put on and taking off regular upper body clothing?: None Help from another person to put on and taking off regular lower body clothing?: A Little 6 Click Score: 20   End of Session Equipment Utilized During Treatment: Gait belt;Rolling walker  Activity Tolerance: Patient tolerated treatment well Patient left: in chair;with call bell/phone within reach;with chair alarm set  OT Visit Diagnosis: Unsteadiness on feet (R26.81);Other abnormalities of gait and mobility (R26.89);Pain Pain - Right/Left: Left Pain - part of body: Leg;Ankle and joints of foot                Time: 0539-7673 OT Time Calculation (min): 24 min Charges:  OT General Charges $OT Visit: 1 Visit OT Evaluation $OT Eval Moderate Complexity: 1 Mod OT Treatments $Self Care/Home Management : 8-22 mins   Galen Manila 02/11/2021, 2:09 PM

## 2021-02-11 NOTE — Plan of Care (Signed)
Pt is alert and oriented x 4, sitting up in the chair, denies any pain, wound vac in place, dressing clean/dry/intact, no drainage noted.  Problem: Clinical Measurements: Goal: Will remain free from infection Outcome: Progressing   Problem: Pain Managment: Goal: General experience of comfort will improve Outcome: Progressing   Problem: Safety: Goal: Ability to remain free from injury will improve Outcome: Progressing   Problem: Skin Integrity: Goal: Risk for impaired skin integrity will decrease Outcome: Progressing

## 2021-02-11 NOTE — Progress Notes (Signed)
Pt arrived from PACU via bed. 2 RN skin assessment completed with charge nurse Winfield Rast. VSS at this time. Pt does not complain of any pain. Wound vac dressing is clean/dry/intact. KCI wound vac plugged into wall, continuous at 125. Pt educated on how to use IS. Call bell within reach. SCDs in place. Bed is low and wheels locked. Will continue to monitor.

## 2021-02-11 NOTE — Evaluation (Signed)
Physical Therapy Evaluation Patient Details Name: Charles Hicks MRN: [id number] DOB: 28-Dec-1970 Today's Date: 02/11/2021   History of Present Illness  Charles Hicks is 50 year old male who sustained an accidental chainsaw injury to left distal medial leg/ankle with complex wound including tendon, nerve and vascular injury. Pt appears to have injury to his posterior tibial nerve as well as his flexor digitorum longus, posterior tibial artery was ligated at bedside by vascular surgery. Pt underwent and I&D of L ankle and was placed in a splint on 3/21 and will be NWBx4 weeks. PMH: unremarkable.    Clinical Impression  Pt admitted with above. Pt tolerating mobility well and able to maintain L LE NWB. Pt denies pain but reports numbness. Anticipate pt to progress well and be safe to d/c home with family once medically stable. Pt will need RW until L LE WBing status advanced. Pt will need to complete 5 step stair negotiation prior to d/c. Acute PT to cont to follow.    Follow Up Recommendations No PT follow up;Supervision/Assistance - 24 hour (will likely need outpt PT for L foot once cleared by MD due to nerve injury)    Equipment Recommendations  Rolling walker with 5" wheels    Recommendations for Other Services       Precautions / Restrictions Precautions Precautions: Fall Precaution Comments: L LE wound vac, splint with ace wrap Restrictions Weight Bearing Restrictions: Yes LLE Weight Bearing: Non weight bearing      Mobility  Bed Mobility Overal bed mobility: Needs Assistance Bed Mobility: Supine to Sit     Supine to sit: Supervision     General bed mobility comments: assist for wound vac, HOB elevated, no physical assist for LE or trunk elevation    Transfers Overall transfer level: Needs assistance Equipment used: Rolling walker (2 wheeled) Transfers: Sit to/from Stand Sit to Stand: Min guard         General transfer comment: min guard for safety, verbal cues  for hand placement, good technique  Ambulation/Gait Ambulation/Gait assistance: Min guard Gait Distance (Feet): 50 Feet Assistive device: Rolling walker (2 wheeled) Gait Pattern/deviations: Step-to pattern Gait velocity: dec Gait velocity interpretation: <1.31 ft/sec, indicative of household ambulator General Gait Details: pt with hop to pattern, denies throbbing, able to maintain L LE NWB, good walker management  Stairs            Wheelchair Mobility    Modified Rankin (Stroke Patients Only)       Balance Overall balance assessment: Needs assistance Sitting-balance support: Feet supported;No upper extremity supported Sitting balance-Leahy Scale: Good     Standing balance support: Bilateral upper extremity supported Standing balance-Leahy Scale: Poor Standing balance comment: had to lean on sink via elbows to wash hands, stood to urinate with L UE support on walker, pt able to maintain L LE NWB                             Pertinent Vitals/Pain Pain Assessment: 0-10 Pain Score: 0-No pain Pain Descriptors / Indicators: Numbness Pain Intervention(s): Monitored during session    Home Living Family/patient expects to be discharged to:: Private residence Living Arrangements: Spouse/significant other;Children Available Help at Discharge: Family;Available 24 hours/day (between spouse and kids) Type of Home: House Home Access: Stairs to enter Entrance Stairs-Rails: Can reach both Entrance Stairs-Number of Steps: 5 Home Layout: One level Home Equipment: Crutches      Prior Function Level of Independence: Independent  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)   Dominant Hand: Right    Extremity/Trunk Assessment   Upper Extremity Assessment Upper Extremity Assessment: Overall WFL for tasks assessed    Lower Extremity Assessment Lower Extremity Assessment: LLE deficits/detail LLE Deficits / Details: L ankle in splint, able to  initiate wiggling toes, can do SLR and move L LE around in bed without difficulty LLE Sensation: WNL;decreased light touch    Cervical / Trunk Assessment Cervical / Trunk Assessment: Normal  Communication   Communication: No difficulties  Cognition Arousal/Alertness: Awake/alert Behavior During Therapy: WFL for tasks assessed/performed Overall Cognitive Status: Within Functional Limits for tasks assessed                                        General Comments General comments (skin integrity, edema, etc.): pt with L LE wound vac, splint and ace wrap in place    Exercises General Exercises - Lower Extremity Straight Leg Raises: AROM;10 reps;Supine   Assessment/Plan    PT Assessment Patient needs continued PT services  PT Problem List Decreased strength;Decreased range of motion;Decreased activity tolerance;Decreased balance;Decreased mobility;Decreased coordination;Decreased cognition;Decreased knowledge of use of DME;Decreased safety awareness       PT Treatment Interventions DME instruction;Gait training;Stair training;Functional mobility training;Therapeutic activities;Therapeutic exercise;Balance training;Neuromuscular re-education    PT Goals (Current goals can be found in the Care Plan section)  Acute Rehab PT Goals Patient Stated Goal: home PT Goal Formulation: With patient Time For Goal Achievement: 02/25/21 Potential to Achieve Goals: Good    Frequency Min 4X/week   Barriers to discharge        Co-evaluation               AM-PAC PT "6 Clicks" Mobility  Outcome Measure Help needed turning from your back to your side while in a flat bed without using bedrails?: None Help needed moving from lying on your back to sitting on the side of a flat bed without using bedrails?: None Help needed moving to and from a bed to a chair (including a wheelchair)?: A Little Help needed standing up from a chair using your arms (e.g., wheelchair or bedside  chair)?: A Little Help needed to walk in hospital room?: A Little Help needed climbing 3-5 steps with a railing? : A Little 6 Click Score: 20    End of Session Equipment Utilized During Treatment: Gait belt Activity Tolerance: Patient tolerated treatment well Patient left: in chair;with call bell/phone within reach;with chair alarm set Nurse Communication: Mobility status PT Visit Diagnosis: Unsteadiness on feet (R26.81);Difficulty in walking, not elsewhere classified (R26.2)    Time: 4010-2725 PT Time Calculation (min) (ACUTE ONLY): 30 min   Charges:   PT Evaluation $PT Eval Moderate Complexity: 1 Mod PT Treatments $Gait Training: 8-22 mins        Lewis Shock, PT, DPT Acute Rehabilitation Services Pager #: [phone] Office #: [phone]   Iona Hansen 02/11/2021, 9:08 AM

## 2021-02-11 NOTE — Op Note (Signed)
NAME: Charles Hicks, RINGER MEDICAL RECORD NO: [id number] ACCOUNT NO: [id number] DATE OF BIRTH: Jan 11, 1971 FACILITY: [facility] LOCATION: [facility] PHYSICIAN: Doralee Albino. Carola Frost, MD  Operative Report   DATE OF PROCEDURE: 02/10/2021   PREOPERATIVE DIAGNOSES:   1.  Left ankle chainsaw laceration, segmental. 2.  Laceration of the left posterior tibial nerve and artery. 3.  Left flexor tendon disruption at the calf and ankle.  POSTOPERATIVE DIAGNOSIS:  1.  Left ankle chainsaw laceration, segmental. 2.  Laceration of the left posterior tibial nerve and artery. 3.  Left flexor tendon disruption at the calf and ankle. 4.  Open medial malleolus fracture of the tibia.  PROCEDURES:   1.  Irrigation and debridement of open fracture including removal of bone. 2.  Exploration of penetrating wound, left calf and ankle. 3.  Repair of left flexor tendon, left posterior tibial tendon tear. 4.  Repair of FDP tear. 5.  Repair of medial retinaculum. 6.  Complex repair of 15 cm wound. 7.  Application of small wound VAC.  SURGEON:  Myrene Galas, MD.  ASSISTANT:  Montez Morita, PA-C.  ANESTHESIA:  General.  COMPLICATIONS:  None.  TOURNIQUET:  None.  DISPOSITION:  To PACU.  CONDITION:  Stable.  BRIEF INDICATIONS FOR PROCEDURE:  This is a very pleasant 50 year old male who sustained a laceration with a chainsaw to his left calf and ankle.  Pulsatile bleeding was noted in the trauma bay and in the Emergency Department the patient underwent  ligation by the vascular surgeon.  I discussed with the patient preoperatively risks and benefits of surgical treatment including the possibility of failure to prevent infection, need for further surgery, DVT, PE, loss of motion, disruption of his  repair, multiple others.  After acknowledging these risks, he provided consent to proceed.  BRIEF SUMMARY:  The patient received antibiotics preoperatively consisting of Ancef.  His left lower extremity was prepped and draped in  the usual sterile fashion after induction of general anesthesia.  This included a chlorhexidine wash, Betadine scrub  and paint.  A standard drape was performed.  Time-out held, the wound was noted to have continued bleeding in spite of the ligation of the tibial artery.  I immediately clamped three vessels, which were venous.  There was approximately 200 mL of blood  loss; however, from the time of going back to the OR to this point.  I used a small caliber 2-0 Prolene.  I also ligated the vessel more proximal to where Dr. Pascal Lux from vascular had done so and then was able to excise his knot which was somewhat  contaminated.  I did do this after some of the lavage; however.  A chlorhexidine soap was used to generate a soapy irrigation and 9 liters of saline were taken through the wounds, which were segmental again one  posterior at the medial malleolus and one  more proximal in the calf.  The hematoma contaminated tissue was bluntly removed and then sharply excised with a scalpel and scissor.  I was careful to remove all contaminated fascia, muscle and tendon that was not viable or was contaminated.  The bone  was then carefully inspected, here there were two teeth taken out of the medial malleolus with exposed bone sharp edges and ground in debris.  I mobilized the posterior tibial tendon posteriorly and was able to see disruption of the retinaculum here as  well.  There were some bone fragments within it elevating directly off the bone.  This was brought anteriorly exposing the contaminated  segments, which were removed with a rongeur and a curette back to healthy smooth bleeding bone without contamination.   Again, that was with a rongeur and curette. Pulsatile saline was used in addition to 6 of the liters, which were with a high-volume low-pressure.  In the distal wound edge excision of the skin, which was rather thin was performed with a scalpel as well.   There was a partial tear of the posterior tibial  tendon distally where it was completely transected proximally. At the distal end I tapered the tendon edges as more than 50% of the tendon remained in continuity.  However, proximally, I was unable to  get an anatomic repair of both the posterior tibial muscle belly and the flexor digitorum longus and consequently I chose to do a combination technique with a Kessler type stitch initially using a large #1 PDS and then oversewing this in a  circumferential fashion to provide for additional strands of repair.  My assistant helped to hold the knot and maintain tension with flexion and inversion of the ankle.  After repair of these 2 tendons, muscle bellies at the musculotendinous junction I  then was able to turn attention to the retinaculum.  I created a leaf for a step type cut repair and used a 0 Prolene figure-of-eight to reconstruct this retinaculum.  After placement of these 2 figure-of-eight sutures, the tendon did slide smoothly.   Lastly, a layered closure using 2-0 PDS and 2-0 nylon was performed over the course of this 16 cm wound and in total divided into 12 and 4 for the more distal wound.  Lastly, a Prevena wound VAC was placed over this because of the contamination and soft  tissue loss and disruption.  Then, a sterile gently compressive dressing and posterior and stirrup splint applied.  Montez Morita, PA-C, was present and assisting throughout.  POSTOPERATIVE PLAN:  The patient will be nonweightbearing initially with continuation of antibiotics for 48-72 hours.  We will anticipate discharge with followup in the office in 1 week, transition into a Cam boot and weightbearing as tolerated to be  anticipated in a protected fashion going forward with no active flexion against resistance for 6 to 8 weeks.  He remains at elevated risk for complications including infection.   SUJ D: 02/10/2021 11:35:48 pm T: 02/11/2021 1:57:00 am  JOB: [id number]/ [id number]

## 2021-02-11 NOTE — Progress Notes (Signed)
Orthopaedic Trauma Service Progress Note  Patient ID: Charles Hicks MRN: [id number] DOB/AGE: [birth year] 51 y.o.  Subjective:  Doing well this am  Pain feels much better  No other concerns   On rocephin and flagyl for open injury (dirty mechanism)   ROS As above  Objective:   VITALS:   Vitals:   02/10/21 2315 02/10/21 2327 02/11/21 0447 02/11/21 0811  BP:  (!) 143/85 116/68 129/79  Pulse: 99 (!) 103 98 99  Resp: 18 18 17 17   Temp: 98.6 F (37 C) 98 F (36.7 C) 98.7 F (37.1 C) 98.2 F (36.8 C)  TempSrc:   Oral Oral  SpO2: 98% 99% 98% 100%  Weight:      Height:        Estimated body mass index is 26.63 kg/m as calculated from the following:   Height as of this encounter: 5\' 6"  (1.676 m).   Weight as of this encounter: 74.8 kg.   Intake/Output      03/21 0701 03/22 0700 03/22 0701 03/23 0700   I.V. (mL/kg) 2000 (26.7)    IV Piggyback 500    Total Intake(mL/kg) 2500 (33.4)    Urine (mL/kg/hr)  540 (2.3)   Drains 0    Blood 200    Total Output 200 540   Net +2300 -540          LABS  Results for orders placed or performed during the hospital encounter of 02/10/21 (from the past 24 hour(s))  Resp Panel by RT-PCR (Flu A&B, Covid) Nasopharyngeal Swab     Status: None   Collection Time: 02/10/21  4:14 PM   Specimen: Nasopharyngeal Swab; Nasopharyngeal(NP) swabs in vial transport medium  Result Value Ref Range   SARS Coronavirus 2 by RT PCR NEGATIVE NEGATIVE   Influenza A by PCR NEGATIVE NEGATIVE   Influenza B by PCR NEGATIVE NEGATIVE  Comprehensive metabolic panel     Status: Abnormal   Collection Time: 02/10/21  4:14 PM  Result Value Ref Range   Sodium 138 135 - 145 mmol/L   Potassium 4.3 3.5 - 5.1 mmol/L   Chloride 104 98 - 111 mmol/L   CO2 23 22 - 32 mmol/L   Glucose, Bld 134 (H) 70 - 99 mg/dL   BUN 15 6 - 20 mg/dL   Creatinine, Ser 02/12/21 0.61 - 1.24 mg/dL   Calcium 8.9  8.9 - 02/12/21 mg/dL   Total Protein HEMOLYSIS AT THIS LEVEL MAY AFFECT RESULT 6.5 - 8.1 g/dL   Albumin 3.8 3.5 - 5.0 g/dL   AST 42 (H) 15 - 41 U/L   ALT 23 0 - 44 U/L   Alkaline Phosphatase 36 (L) 38 - 126 U/L   Total Bilirubin 1.1 0.3 - 1.2 mg/dL   GFR, Estimated 7.00 17.4 mL/min   Anion gap 11 5 - 15  CBC     Status: Abnormal   Collection Time: 02/10/21  4:14 PM  Result Value Ref Range   WBC 6.7 4.0 - 10.5 K/uL   RBC 4.27 4.22 - 5.81 MIL/uL   Hemoglobin 13.0 13.0 - 17.0 g/dL   HCT >49 (L) 02/12/21 - 67.5 %   MCV 87.1 80.0 - 100.0 fL   MCH 30.4 26.0 - 34.0 pg   MCHC 34.9 30.0 - 36.0 g/dL   RDW 14.9  11.5 - 15.5 %   Platelets 224 150 - 400 K/uL   nRBC 0.0 0.0 - 0.2 %  Ethanol     Status: None   Collection Time: 02/10/21  4:14 PM  Result Value Ref Range   Alcohol, Ethyl (B) <10 <10 mg/dL  Lactic acid, plasma     Status: Abnormal   Collection Time: 02/10/21  4:14 PM  Result Value Ref Range   Lactic Acid, Venous 2.6 (HH) 0.5 - 1.9 mmol/L  Protime-INR     Status: None   Collection Time: 02/10/21  4:14 PM  Result Value Ref Range   Prothrombin Time 13.3 11.4 - 15.2 seconds   INR 1.1 0.8 - 1.2  Type and screen [facility]     Status: None   Collection Time: 02/10/21  4:15 PM  Result Value Ref Range   ABO/RH(D) O POS    Antibody Screen NEG    Sample Expiration      02/13/2021,2359 Performed at [facility], [address], [place], [place] [address]   I-Stat Chem 8, ED     Status: Abnormal   Collection Time: 02/10/21  4:40 PM  Result Value Ref Range   Sodium 140 135 - 145 mmol/L   Potassium 3.7 3.5 - 5.1 mmol/L   Chloride 105 98 - 111 mmol/L   BUN 19 6 - 20 mg/dL   Creatinine, Ser 6.04 0.61 - 1.24 mg/dL   Glucose, Bld 540 (H) 70 - 99 mg/dL   Calcium, Ion 9.81 (L) 1.15 - 1.40 mmol/L   TCO2 25 22 - 32 mmol/L   Hemoglobin 12.6 (L) 13.0 - 17.0 g/dL   HCT 19.1 (L) 47.8 - 29.5 %  Protein, total     Status: Abnormal   Collection Time: 02/10/21  6:58 PM   Result Value Ref Range   Total Protein 6.1 (L) 6.5 - 8.1 g/dL  ABO/Rh     Status: None   Collection Time: 02/10/21  7:15 PM  Result Value Ref Range   ABO/RH(D)      O POS Performed at [facility], [address], [place], [place] [address]   Magnesium     Status: Abnormal   Collection Time: 02/11/21 12:32 AM  Result Value Ref Range   Magnesium 1.5 (L) 1.7 - 2.4 mg/dL  Phosphorus     Status: None   Collection Time: 02/11/21 12:32 AM  Result Value Ref Range   Phosphorus 3.1 2.5 - 4.6 mg/dL  CBC     Status: Abnormal   Collection Time: 02/11/21 12:32 AM  Result Value Ref Range   WBC 10.1 4.0 - 10.5 K/uL   RBC 3.07 (L) 4.22 - 5.81 MIL/uL   Hemoglobin 9.2 (L) 13.0 - 17.0 g/dL   HCT 86.5 (L) 78.4 - 69.6 %   MCV 88.3 80.0 - 100.0 fL   MCH 30.0 26.0 - 34.0 pg   MCHC 33.9 30.0 - 36.0 g/dL   RDW 29.5 28.4 - 13.2 %   Platelets 156 150 - 400 K/uL   nRBC 0.0 0.0 - 0.2 %  VITAMIN D 25 Hydroxy (Vit-D Deficiency, Fractures)     Status: Abnormal   Collection Time: 02/11/21 12:32 AM  Result Value Ref Range   Vit D, 25-Hydroxy 12.81 (L) 30 - 100 ng/mL     PHYSICAL EXAM:   Gen: awake, sitting up in chair, NAD, appears well Lungs: unlabored Cardiac: Reg Abd: + BS, NTND Ext:  Left Lower Extremity   Splint fitting well   Ext warm   Minimal swelling    Vac with good seal   DPN, SPN sensation intact  TN sensation markedly diminished   Did not have pt perform motor functions due to muscle repair   Assessment/Plan: 1 Day Post-Op   Active Problems:   Status post surgery   Open fracture of malleolus of left ankle   Anti-infectives (From admission, onward)   Start     Dose/Rate Route Frequency Ordered Stop   02/11/21 0000  metroNIDAZOLE (FLAGYL) IVPB 500 mg       "And" Linked Group Details   500 mg 100 mL/hr over 60 Minutes Intravenous Every 8 hours 02/10/21 2329 02/13/21 2359   02/10/21 2345  cefTRIAXone (ROCEPHIN) 2 g in sodium chloride 0.9 % 100 mL IVPB        "And" Linked Group Details   2 g 200 mL/hr over 30 Minutes Intravenous Daily at bedtime 02/10/21 2329 02/13/21 2159   02/10/21 2000  ceFAZolin (ANCEF) IVPB 1 g/50 mL premix  Status:  Discontinued        1 g 100 mL/hr over 30 Minutes Intravenous On call to O.R. 02/10/21 1932 02/10/21 1949   02/10/21 2000  ceFAZolin (ANCEF) IVPB 2g/100 mL premix        2 g 200 mL/hr over 30 Minutes Intravenous On call to O.R. 02/10/21 1949 02/10/21 2021   02/10/21 1630  ceFAZolin (ANCEF) IVPB 2g/100 mL premix        2 g 200 mL/hr over 30 Minutes Intravenous  Once 02/10/21 1603 02/10/21 1809    .  POD/HD#: 1  50 y/o male s/p chainsaw injury to L lower leg with significant soft tissue injury   - complex laceration L ankle due to chainsaw, L flexor tendon disruption at calf and ankle, Laceration of L posterior tibial nerve and artery   NWB L leg x 4 weeks   No ankle ROM x 4 weeks  OOB as tolerated otherwise  Ice and elevate  PT/OT evals    Prevena x 14 days then remove    Cast vs cam boot at follow up   Continue IV abx for total of 3 doses   - Pain management:  Titrate accordingly   - ABL anemia/Hemodynamics  Stable  - Medical issues   EtOH use    CIWA   - DVT/PE prophylaxis:  Lovenox while inpt   - ID:   Rocephin and Flagyl   - Metabolic Bone Disease:  Vitamin d deficiency    Supplement    - Activity:  As above  - FEN/GI prophylaxis/Foley/Lines:  Reg diet   -Ex-fix/Splint care:  Keep splint clean and dry   - Impediments to fracture healing/soft tissue healing:  Mechanism of injury   etoh use   - Dispo:  Continue inpatient care for IV abx  Home tomorrow afternoon after last dose of IV abx    Mearl Latin, PA-C [phone] (C) 02/11/2021, 10:06 AM  [facility] [address] [place] [address] [phone] Val [phone] (F)    After 5pm and on the weekends please log on to Amion, go to orthopaedics and the look under the [facility] Call for the provider(s) on call. You can also call our office at [phone] and then follow the prompts to be connected to the call team.

## 2021-02-11 NOTE — Anesthesia Postprocedure Evaluation (Signed)
Anesthesia Post Note  Patient: Charles Hicks  Procedure(s) Performed: IRRIGATION AND DEBRIDEMENT ANKLE (Left )     Patient location during evaluation: PACU Anesthesia Type: General Level of consciousness: awake and alert Pain management: pain level controlled Vital Signs Assessment: post-procedure vital signs reviewed and stable Respiratory status: spontaneous breathing, nonlabored ventilation, respiratory function stable and patient connected to nasal cannula oxygen Cardiovascular status: blood pressure returned to baseline and stable Postop Assessment: no apparent nausea or vomiting Anesthetic complications: no   No complications documented.  Last Vitals:  Vitals:   02/10/21 2315 02/10/21 2327  BP:  (!) 143/85  Pulse: 99 (!) 103  Resp: 18 18  Temp: 37 C 36.7 C  SpO2: 98% 99%    Last Pain:  Vitals:   02/10/21 2315  TempSrc:   PainSc: 0-No pain                 Kennieth Rad

## 2021-02-12 LAB — CBC
HCT: 21.5 % — ABNORMAL LOW (ref 39.0–52.0)
HCT: 23.7 % — ABNORMAL LOW (ref 39.0–52.0)
Hemoglobin: 7.4 g/dL — ABNORMAL LOW (ref 13.0–17.0)
Hemoglobin: 7.8 g/dL — ABNORMAL LOW (ref 13.0–17.0)
MCH: 30.2 pg (ref 26.0–34.0)
MCH: 29.4 pg (ref 26.0–34.0)
MCHC: 34.4 g/dL (ref 30.0–36.0)
MCHC: 32.9 g/dL (ref 30.0–36.0)
MCV: 87.8 fL (ref 80.0–100.0)
MCV: 89.4 fL (ref 80.0–100.0)
Platelets: 123 10*3/uL — ABNORMAL LOW (ref 150–400)
Platelets: 143 10*3/uL — ABNORMAL LOW (ref 150–400)
RBC: 2.45 MIL/uL — ABNORMAL LOW (ref 4.22–5.81)
RBC: 2.65 MIL/uL — ABNORMAL LOW (ref 4.22–5.81)
RDW: 15.2 % (ref 11.5–15.5)
RDW: 15.2 % (ref 11.5–15.5)
WBC: 6.7 10*3/uL (ref 4.0–10.5)
WBC: 8.9 10*3/uL (ref 4.0–10.5)
nRBC: 0 % (ref 0.0–0.2)
nRBC: 0 % (ref 0.0–0.2)

## 2021-02-12 LAB — PREALBUMIN: Prealbumin: 29.2 mg/dL (ref 18–38)

## 2021-02-12 LAB — MAGNESIUM: Magnesium: 2.2 mg/dL (ref 1.7–2.4)

## 2021-02-12 NOTE — Plan of Care (Signed)
  Problem: Clinical Measurements: Goal: Will remain free from infection Outcome: Progressing Goal: Diagnostic test results will improve Outcome: Progressing   Problem: Pain Managment: Goal: General experience of comfort will improve Outcome: Progressing   Problem: Safety: Goal: Ability to remain free from injury will improve Outcome: Progressing   Problem: Skin Integrity: Goal: Risk for impaired skin integrity will decrease Outcome: Progressing   

## 2021-02-12 NOTE — Progress Notes (Signed)
Orthopaedic Trauma Service Progress Note  Patient ID: Charles Hicks MRN: [id number] DOB/AGE: November 02, 1971 50 y.o.  Subjective:  Doing ok  More pain today No new issues   No lightheadedness or dizziness  No CP or SOB    ROS As above  Objective:   VITALS:   Vitals:   02/11/21 1516 02/11/21 1952 02/12/21 0300 02/12/21 0803  BP: 132/77 134/75 126/70 135/84  Pulse: 97 91 78 90  Resp: 17 18 17 17   Temp: 98.4 F (36.9 C) 98.6 F (37 C) 98 F (36.7 C) 98.8 F (37.1 C)  TempSrc: Oral Oral Oral Oral  SpO2: 100% 100% 99% 98%  Weight:      Height:        Estimated body mass index is 26.63 kg/m as calculated from the following:   Height as of this encounter: 5\' 6"  (1.676 m).   Weight as of this encounter: 74.8 kg.   Intake/Output      03/22 0701 03/23 0700 03/23 0701 03/24 0700   I.V. (mL/kg) 1235.9 (16.5)    IV Piggyback 300    Total Intake(mL/kg) 1535.9 (20.5)    Urine (mL/kg/hr) 2440 (1.4)    Drains 100    Blood     Total Output 2540    Net -1004.1         Urine Occurrence 1 x      LABS  Results for orders placed or performed during the hospital encounter of 02/10/21 (from the past 24 hour(s))  Urinalysis, Routine w reflex microscopic Urine, Clean Catch     Status: Abnormal   Collection Time: 02/11/21 11:04 AM  Result Value Ref Range   Color, Urine STRAW (A) YELLOW   APPearance CLEAR CLEAR   Specific Gravity, Urine 1.011 1.005 - 1.030   pH 5.0 5.0 - 8.0   Glucose, UA NEGATIVE NEGATIVE mg/dL   Hgb urine dipstick NEGATIVE NEGATIVE   Bilirubin Urine NEGATIVE NEGATIVE   Ketones, ur NEGATIVE NEGATIVE mg/dL   Protein, ur NEGATIVE NEGATIVE mg/dL   Nitrite NEGATIVE NEGATIVE   Leukocytes,Ua NEGATIVE NEGATIVE  Magnesium     Status: None   Collection Time: 02/12/21  4:07 AM  Result Value Ref Range   Magnesium 2.2 1.7 - 2.4 mg/dL  CBC     Status: Abnormal   Collection Time: 02/12/21   4:07 AM  Result Value Ref Range   WBC 6.7 4.0 - 10.5 K/uL   RBC 2.45 (L) 4.22 - 5.81 MIL/uL   Hemoglobin 7.4 (L) 13.0 - 17.0 g/dL   HCT 02/14/21 (L) 02/14/21 - 65.9 %   MCV 87.8 80.0 - 100.0 fL   MCH 30.2 26.0 - 34.0 pg   MCHC 34.4 30.0 - 36.0 g/dL   RDW 93.5 70.1 - 77.9 %   Platelets 123 (L) 150 - 400 K/uL   nRBC 0.0 0.0 - 0.2 %  Prealbumin     Status: None   Collection Time: 02/12/21  4:07 AM  Result Value Ref Range   Prealbumin 29.2 18 - 38 mg/dL     PHYSICAL EXAM:   Gen: awake, sitting up in chair, NAD, appears well Lungs: unlabored Cardiac: Reg Abd: + BS, NTND Ext:       Left Lower Extremity  Splint fitting well              Ext warm              Minimal swelling                       Vac with good seal              DPN, SPN sensation intact             TN sensation markedly diminished              Did not have pt perform motor functions due to muscle repair   Assessment/Plan: 2 Days Post-Op   Active Problems:   Status post surgery   Open fracture of malleolus of left ankle   Anti-infectives (From admission, onward)   Start     Dose/Rate Route Frequency Ordered Stop   02/11/21 0000  metroNIDAZOLE (FLAGYL) IVPB 500 mg       "And" Linked Group Details   500 mg 100 mL/hr over 60 Minutes Intravenous Every 8 hours 02/10/21 2329 02/13/21 2359   02/10/21 2345  cefTRIAXone (ROCEPHIN) 2 g in sodium chloride 0.9 % 100 mL IVPB       "And" Linked Group Details   2 g 200 mL/hr over 30 Minutes Intravenous Daily at bedtime 02/10/21 2329 02/13/21 2159   02/10/21 2000  ceFAZolin (ANCEF) IVPB 1 g/50 mL premix  Status:  Discontinued        1 g 100 mL/hr over 30 Minutes Intravenous On call to O.R. 02/10/21 1932 02/10/21 1949   02/10/21 2000  ceFAZolin (ANCEF) IVPB 2g/100 mL premix        2 g 200 mL/hr over 30 Minutes Intravenous On call to O.R. 02/10/21 1949 02/10/21 2021   02/10/21 1630  ceFAZolin (ANCEF) IVPB 2g/100 mL premix        2 g 200 mL/hr over 30 Minutes  Intravenous  Once 02/10/21 1603 02/10/21 1809    .  POD/HD#: 2  50 y/o male s/p chainsaw injury to L lower leg with significant soft tissue injury   - complex laceration L ankle due to chainsaw, L flexor tendon disruption at calf and ankle, Laceration of L posterior tibial nerve and artery              NWB L leg x 4 weeks               No ankle ROM x 4 weeks             OOB as tolerated otherwise             Ice and elevate             PT/OT                           Prevena x 7 days then remove                          Cast vs cam boot at follow up              Continue IV abx per open fracture protocol  - Pain management:             Titrate accordingly   Continue with current regimen   - ABL anemia/Hemodynamics  modest drop in h/h  Recheck this afternoon  Remains asymptomatic   - Medical issues              EtOH use                          CIWA              - DVT/PE prophylaxis:             Lovenox while inpt   - ID:              Rocephin and Flagyl   - Metabolic Bone Disease:             Vitamin d deficiency                          Supplement               - Activity:             As above  - FEN/GI prophylaxis/Foley/Lines:             Reg diet   -Ex-fix/Splint care:             Keep splint clean and dry   - Impediments to fracture healing/soft tissue healing:             Mechanism of injury              etoh use   - Dispo:             Continue inpatient care for IV abx             Home tomorrow   Follow up with OTS in 1 week     Mearl Latin, PA-C [phone] (C) 02/12/2021, 9:49 AM  [facility] [address] [place] [address] [phone] Val [phone] (F)    After 5pm and on the weekends please log on to Amion, go to orthopaedics and the look under the [facility] Call for the provider(s) on call. You can also call our office at [phone] and then follow the prompts  to be connected to the call team.

## 2021-02-12 NOTE — Progress Notes (Signed)
Orthopedic Tech Progress Note Patient Details:  VANNA SHAVERS Nov 08, 1971 [id number] Dropped off a CAM WALKER BOOT to patient room. Let him know to bring this boot with him to his follow up with MD/PA Ortho Devices Type of Ortho Device: CAM walker Ortho Device/Splint Location: LLE Ortho Device/Splint Interventions: Ordered,Other (comment)   Post Interventions Patient Tolerated: Well Instructions Provided: Care of device   Donald Pore 02/12/2021, 10:30 AM

## 2021-02-12 NOTE — TOC Initial Note (Signed)
Transition of Care [facility]) - Initial/Assessment Note    Patient Details  Name: Charles Hicks MRN: [id number] Date of Birth: 04/17/1971  Transition of Care [facility]) CM/SW Contact:    Reola Mosher Transition of Care Supervisor Phone Number: [phone] 02/12/2021, 11:05 AM  Clinical Narrative:                 Patient was independent prior to admission. Noted not medical insurance. Financial Counselor to screen pt to determine what he might qualify for. TOC will continue to follow for disposition needs.  Expected Discharge Plan: Home/Self Care     Patient Goals and CMS Choice        Expected Discharge Plan and Services Expected Discharge Plan: Home/Self Care                                              Prior Living Arrangements/Services   Lives with:: Self                   Activities of Daily Living Home Assistive Devices/Equipment: None ADL Screening (condition at time of admission) Patient's cognitive ability adequate to safely complete daily activities?: Yes Is the patient deaf or have difficulty hearing?: No Does the patient have difficulty seeing, even when wearing glasses/contacts?: No Does the patient have difficulty concentrating, remembering, or making decisions?: No Patient able to express need for assistance with ADLs?: Yes Does the patient have difficulty dressing or bathing?: No Independently performs ADLs?: No In/Out Bed: Needs assistance Does the patient have difficulty walking or climbing stairs?: No Weakness of Legs: None Weakness of Arms/Hands: None  Permission Sought/Granted                  Emotional Assessment              Admission diagnosis:  Trauma [T14.90XA] Laceration of left lower extremity, initial encounter [S81.812A] Contact with chainsaw as cause of accidental injury [W29.3XXA] Status post surgery [Z98.890] Open fracture of malleolus of left ankle [S82.892B] Patient Active Problem  List   Diagnosis Date Noted  . Status post surgery 02/10/2021  . Open fracture of malleolus of left ankle 02/10/2021   PCP:  Patient, No Pcp Per Pharmacy:   [facility] [phone] - [place], [place] - [address] AT [facility] [address] [place] [address] Phone: [phone] Fax: [phone]     Social Determinants of Health (SDOH) Interventions    Readmission Risk Interventions No flowsheet data found.

## 2021-02-12 NOTE — Progress Notes (Signed)
Physical Therapy Treatment Patient Details Name: Charles Hicks MRN: [id number] DOB: 06/28/1971 Today's Date: 02/12/2021    History of Present Illness Charles Hicks is 50 year old male who sustained an accidental chainsaw injury to left distal medial leg/ankle with complex wound including tendon, nerve and vascular injury. Pt appears to have injury to his posterior tibial nerve as well as his flexor digitorum longus, posterior tibial artery was ligated at bedside by vascular surgery. Pt underwent and I&D of L ankle and was placed in a splint on 3/21 and will be NWBx4 weeks. PMH: unremarkable.    PT Comments    Pt progressing well and continues to have minimal pain. Pt able to safely amb with RW up to 100' today and trialed stair negotiation with bilat hand rails to mimic home set up. Acute PT to cont to follow to progress mobility. PT did inform pt to take CAM boot home with him and bring with him to his follow up for when they remove the splint that is currently on L LE    Follow Up Recommendations  No PT follow up;Supervision/Assistance - 24 hour (may need outpt PT once cleared by MD due to nerve injury)     Equipment Recommendations  Rolling walker with 5" wheels    Recommendations for Other Services       Precautions / Restrictions Precautions Precautions: Fall Precaution Comments: L LE wound vac, splint with ace wrap Required Braces or Orthoses: Other Brace Other Brace: pt given CAM boot but is to take home and bring with him to his follow up with Dr. Carola Frost next week, pt instructed not to put CAM boot on over splint Restrictions Weight Bearing Restrictions: Yes LLE Weight Bearing: Non weight bearing    Mobility  Bed Mobility               General bed mobility comments: pt up in recliner    Transfers Overall transfer level: Needs assistance Equipment used: Rolling walker (2 wheeled) Transfers: Sit to/from Stand Sit to Stand: Supervision         General  transfer comment: pt able to maintain L LE NWB, good hand placement, steady  Ambulation/Gait Ambulation/Gait assistance: Min guard Gait Distance (Feet): 100 Feet Assistive device: Rolling walker (2 wheeled) Gait Pattern/deviations: Step-to pattern Gait velocity: dec Gait velocity interpretation: <1.31 ft/sec, indicative of household ambulator General Gait Details: pt with hop to pattern, denies throbbing, able to maintain L LE NWB, good walker management, arms fatigued quicker than R LE   Stairs Stairs: Yes Stairs assistance: Min guard Stair Management: Two rails;Step to pattern;Forwards Number of Stairs: 4 General stair comments: pt able to maintain L LE NWB and hop up stairs to mimic home set up using bilat handrails   Wheelchair Mobility    Modified Rankin (Stroke Patients Only)       Balance Overall balance assessment: Needs assistance Sitting-balance support: Feet supported;No upper extremity supported Sitting balance-Leahy Scale: Good     Standing balance support: Bilateral upper extremity supported;During functional activity Standing balance-Leahy Scale: Poor                              Cognition Arousal/Alertness: Awake/alert Behavior During Therapy: WFL for tasks assessed/performed Overall Cognitive Status: Within Functional Limits for tasks assessed  Exercises      General Comments General comments (skin integrity, edema, etc.): pt with L LE wound vac      Pertinent Vitals/Pain Pain Assessment: 0-10 Pain Score: 2  Pain Descriptors / Indicators: Numbness Pain Intervention(s): Monitored during session    Home Living                      Prior Function            PT Goals (current goals can now be found in the care plan section) Acute Rehab PT Goals Patient Stated Goal: go home Progress towards PT goals: Progressing toward goals    Frequency    Min 4X/week       PT Plan Current plan remains appropriate    Co-evaluation              AM-PAC PT "6 Clicks" Mobility   Outcome Measure  Help needed turning from your back to your side while in a flat bed without using bedrails?: None Help needed moving from lying on your back to sitting on the side of a flat bed without using bedrails?: None Help needed moving to and from a bed to a chair (including a wheelchair)?: A Little Help needed standing up from a chair using your arms (e.g., wheelchair or bedside chair)?: A Little Help needed to walk in hospital room?: A Little Help needed climbing 3-5 steps with a railing? : A Little 6 Click Score: 20    End of Session Equipment Utilized During Treatment: Gait belt Activity Tolerance: Patient tolerated treatment well Patient left: in chair;with call bell/phone within reach;with chair alarm set Nurse Communication: Mobility status PT Visit Diagnosis: Unsteadiness on feet (R26.81);Difficulty in walking, not elsewhere classified (R26.2)     Time: 3329-5188 PT Time Calculation (min) (ACUTE ONLY): 29 min  Charges:  $Gait Training: 23-37 mins                     Lewis Shock, PT, DPT Acute Rehabilitation Services Pager #: [phone] Office #: [phone]    Iona Hansen 02/12/2021, 1:22 PM

## 2021-02-13 ENCOUNTER — Encounter (HOSPITAL_COMMUNITY): Payer: Self-pay | Admitting: Orthopedic Surgery

## 2021-02-13 ENCOUNTER — Other Ambulatory Visit: Payer: Self-pay | Admitting: Orthopedic Surgery

## 2021-02-13 DIAGNOSIS — S96092A Other injury of muscle and tendon of long flexor muscle of toe at ankle and foot level, left foot, initial encounter: Secondary | ICD-10-CM | POA: Diagnosis present

## 2021-02-13 DIAGNOSIS — E559 Vitamin D deficiency, unspecified: Secondary | ICD-10-CM

## 2021-02-13 DIAGNOSIS — Z789 Other specified health status: Secondary | ICD-10-CM | POA: Diagnosis present

## 2021-02-13 DIAGNOSIS — S85162A Unspecified injury of posterior tibial artery, left leg, initial encounter: Secondary | ICD-10-CM

## 2021-02-13 DIAGNOSIS — S81802A Unspecified open wound, left lower leg, initial encounter: Secondary | ICD-10-CM | POA: Diagnosis present

## 2021-02-13 DIAGNOSIS — D62 Acute posthemorrhagic anemia: Secondary | ICD-10-CM

## 2021-02-13 DIAGNOSIS — S8402XA Injury of tibial nerve at lower leg level, left leg, initial encounter: Secondary | ICD-10-CM | POA: Diagnosis present

## 2021-02-13 DIAGNOSIS — W293XXA Contact with powered garden and outdoor hand tools and machinery, initial encounter: Secondary | ICD-10-CM | POA: Diagnosis present

## 2021-02-13 HISTORY — DX: Vitamin D deficiency, unspecified: E55.9

## 2021-02-13 HISTORY — DX: Unspecified injury of posterior tibial artery, left leg, initial encounter: S85.162A

## 2021-02-13 LAB — CBC
HCT: 22 % — ABNORMAL LOW (ref 39.0–52.0)
Hemoglobin: 7.6 g/dL — ABNORMAL LOW (ref 13.0–17.0)
MCH: 30.5 pg (ref 26.0–34.0)
MCHC: 34.5 g/dL (ref 30.0–36.0)
MCV: 88.4 fL (ref 80.0–100.0)
Platelets: 177 10*3/uL (ref 150–400)
RBC: 2.49 MIL/uL — ABNORMAL LOW (ref 4.22–5.81)
RDW: 14.7 % (ref 11.5–15.5)
WBC: 7.9 10*3/uL (ref 4.0–10.5)
nRBC: 0.3 % — ABNORMAL HIGH (ref 0.0–0.2)

## 2021-02-13 MED ORDER — VITAMIN D 125 MCG (5000 UT) PO CAPS: 1.0000 | ORAL_CAPSULE | Freq: Every day | ORAL | 5 refills | Status: AC

## 2021-02-13 MED ORDER — ACETAMINOPHEN 500 MG PO TABS: 500.0000 mg | ORAL_TABLET | Freq: Three times a day (TID) | ORAL | 0 refills | Status: AC

## 2021-02-13 MED ORDER — FERROUS SULFATE 325 (65 FE) MG PO TABS: 325.0000 mg | ORAL_TABLET | Freq: Three times a day (TID) | ORAL | 1 refills | Status: AC

## 2021-02-13 MED ORDER — ASCORBIC ACID 500 MG PO TABS: 500.0000 mg | ORAL_TABLET | Freq: Every day | ORAL | 1 refills | Status: AC

## 2021-02-13 MED ORDER — DOCUSATE SODIUM 100 MG PO CAPS: 100.0000 mg | ORAL_CAPSULE | Freq: Two times a day (BID) | ORAL | 0 refills | Status: AC

## 2021-02-13 MED ORDER — KETOROLAC TROMETHAMINE 10 MG PO TABS: 10.0000 mg | ORAL_TABLET | Freq: Four times a day (QID) | ORAL | 0 refills | Status: AC | PRN

## 2021-02-13 MED ORDER — METHOCARBAMOL 500 MG PO TABS: 500.0000 mg | ORAL_TABLET | Freq: Three times a day (TID) | ORAL | 0 refills | Status: AC | PRN

## 2021-02-13 MED ORDER — OXYCODONE-ACETAMINOPHEN 5-325 MG PO TABS: 1.0000 | ORAL_TABLET | Freq: Three times a day (TID) | ORAL | 0 refills | Status: AC | PRN

## 2021-02-13 MED ORDER — FERROUS SULFATE 325 (65 FE) MG PO TABS: 325.0000 mg | ORAL_TABLET | Freq: Three times a day (TID) | ORAL | Status: DC

## 2021-02-13 MED FILL — FERROUS SULFATE 325 MG TAB: 325 (65 FE) | 30 days supply | Qty: 90 | Fill #0

## 2021-02-13 MED FILL — VITAMIN C 500 MG TABLET: 500 | 30 days supply | Qty: 30 | Fill #0

## 2021-02-13 MED FILL — ACETAMINOPHEN 500MG XT STRE: 500 | 10 days supply | Qty: 30 | Fill #0

## 2021-02-13 MED FILL — DOCUSATE SODIUM 100 MG CAPS: 100 | 5 days supply | Qty: 10 | Fill #0

## 2021-02-13 MED FILL — OXYCODONE-APAP 5-325MG: 5-325 | 6 days supply | Qty: 40 | Fill #0

## 2021-02-13 MED FILL — METHOCARBAMOL 500 MG TABS: 500 | 16 days supply | Qty: 50 | Fill #0

## 2021-02-13 MED FILL — VITAMIN D3 5,000 UNIT TAB: 125 MCG | 30 days supply | Qty: 30 | Fill #0

## 2021-02-13 NOTE — Progress Notes (Signed)
Physical Therapy Treatment Patient Details Name: Charles Hicks MRN: [id number] DOB: 1971/04/11 Today's Date: 02/13/2021    History of Present Illness Charles Hicks is 50 year old male who sustained an accidental chainsaw injury to left distal medial leg/ankle with complex wound including tendon, nerve and vascular injury. Pt appears to have injury to his posterior tibial nerve as well as his flexor digitorum longus, posterior tibial artery was ligated at bedside by vascular surgery. Pt underwent and I&D of L ankle and was placed in a splint on 3/21 and will be NWBx4 weeks. PMH: unremarkable.    PT Comments    Pt demonstrating good progress. Demonstrates good stability with ambulation, transfers, and stairs with RW.  Good adherence with NWB status.  Reports will have supervision at home from son.  Continue to advance independence and mobility while hospitalized, but demonstrates mobility necessary to return home with family from PT perspective.    Follow Up Recommendations  No PT follow up;Supervision for mobility/OOB (may need outpt PT once cleared by surgeon)     Equipment Recommendations  Rolling walker with 5" wheels    Recommendations for Other Services       Precautions / Restrictions Precautions Precautions: Fall Precaution Comments: L LE wound vac, splint with ace wrap Required Braces or Orthoses: Other Brace Other Brace: pt given CAM boot but is to take home and bring with him to his follow up with Dr. Carola Frost next week, pt instructed not to put CAM boot on over splint Restrictions LLE Weight Bearing: Non weight bearing    Mobility  Bed Mobility               General bed mobility comments: pt up in recliner    Transfers   Equipment used: Rolling walker (2 wheeled) Transfers: Sit to/from Stand Sit to Stand: Supervision         General transfer comment: pt able to maintain L LE NWB, good hand placement, steady - perforned x  3  Ambulation/Gait Ambulation/Gait assistance: Min guard Gait Distance (Feet): 120 Feet Assistive device: Rolling walker (2 wheeled)   Gait velocity: dec   General Gait Details: pt with hop to pattern, denies throbbing, able to maintain L LE NWB, good walker management, arms fatigued quicker than R LE; did have 1 mild LOB but recovered independently   Stairs Stairs: Yes Stairs assistance: Min guard Stair Management: Two rails;Step to pattern;Forwards Number of Stairs: 5 General stair comments: pt able to maintain L LE NWB and hop up stairs to mimic home set up using bilat handrails   Wheelchair Mobility    Modified Rankin (Stroke Patients Only)       Balance Overall balance assessment: Needs assistance Sitting-balance support: Feet supported;No upper extremity supported Sitting balance-Leahy Scale: Good     Standing balance support: Single extremity supported Standing balance-Leahy Scale: Poor Standing balance comment: Required UE support but stable on R LE and UE support                            Cognition Arousal/Alertness: Awake/alert Behavior During Therapy: WFL for tasks assessed/performed Overall Cognitive Status: Within Functional Limits for tasks assessed                                        Exercises      General Comments General comments (skin integrity, edema, etc.):  VSS      Pertinent Vitals/Pain Pain Assessment: No/denies pain    Home Living                      Prior Function            PT Goals (current goals can now be found in the care plan section) Acute Rehab PT Goals Patient Stated Goal: go home PT Goal Formulation: With patient Time For Goal Achievement: 02/25/21 Potential to Achieve Goals: Good Progress towards PT goals: Progressing toward goals    Frequency    Min 4X/week      PT Plan Current plan remains appropriate    Co-evaluation              AM-PAC PT "6 Clicks"  Mobility   Outcome Measure  Help needed turning from your back to your side while in a flat bed without using bedrails?: None Help needed moving from lying on your back to sitting on the side of a flat bed without using bedrails?: None Help needed moving to and from a bed to a chair (including a wheelchair)?: A Little Help needed standing up from a chair using your arms (e.g., wheelchair or bedside chair)?: A Little Help needed to walk in hospital room?: A Little Help needed climbing 3-5 steps with a railing? : A Little 6 Click Score: 20    End of Session Equipment Utilized During Treatment: Gait belt Activity Tolerance: Patient tolerated treatment well Patient left: Other (comment) (standing at sink with OT) Nurse Communication: Mobility status PT Visit Diagnosis: Unsteadiness on feet (R26.81);Difficulty in walking, not elsewhere classified (R26.2)     Time: 0768-0881 PT Time Calculation (min) (ACUTE ONLY): 24 min  Charges:  $Gait Training: 23-37 mins                     Anise Salvo, PT Acute Rehab Services Pager [phone] [facility] [phone]     Rayetta Humphrey 02/13/2021, 12:16 PM

## 2021-02-13 NOTE — Progress Notes (Signed)
Orthopaedic Trauma Service Progress Note  Patient ID: Charles Hicks MRN: [id number] DOB/AGE: 51-31-1972 51 y.o.  Subjective:  Doing well Pain controlled Ready to go home  Again reviewed Prevena set up with patient.  Provide him with an additional 2 canisters for drainage  H/H improved  No chest pain, dizziness, lightheadedness  ROS Above  Objective:   VITALS:   Vitals:   02/12/21 2019 02/13/21 0342 02/13/21 0751 02/13/21 0800  BP: 130/86 118/71 116/63   Pulse: 85 80 88   Resp: 17 16 16 18   Temp: 98.8 F (37.1 C) 98 F (36.7 C) 98.5 F (36.9 C)   TempSrc: Oral  Oral   SpO2: 99% 100% 100%   Weight:      Height:        Estimated body mass index is 26.63 kg/m as calculated from the following:   Height as of this encounter: 5\' 6"  (1.676 m).   Weight as of this encounter: 74.8 kg.   Intake/Output      03/23 0701 03/24 0700 03/24 0701 03/25 0700   I.V. (mL/kg)     IV Piggyback     Total Intake(mL/kg)     Urine (mL/kg/hr) 650 (0.4)    Drains 250    Stool 1    Total Output 901    Net -901         Urine Occurrence 1 x      LABS  No results found for this or any previous visit (from the past 24 hour(s)).   PHYSICAL EXAM:    Gen: awake, sitting up in chair, NAD, appears well Lungs: unlabored Cardiac: Reg Abd: + BS, NTND Ext:       Left Lower Extremity              Splint fitting well              Ext warm              Minimal swelling                       Vac with good seal              DPN, SPN sensation intact             TN sensation markedly diminished              Did not have pt perform motor functions due to muscle repair    Transitioned over to [facility] unit without difficulty. Hooked power cord up as well to charge unit    Assessment/Plan: 3 Days Post-Op   Active Problems:   Status post surgery   Open fracture of malleolus of left  ankle   Anti-infectives (From admission, onward)   Start     Dose/Rate Route Frequency Ordered Stop   02/11/21 0000  metroNIDAZOLE (FLAGYL) IVPB 500 mg       "And" Linked Group Details   500 mg 100 mL/hr over 60 Minutes Intravenous Every 8 hours 02/10/21 2329 02/13/21 2359   02/10/21 2345  cefTRIAXone (ROCEPHIN) 2 g in sodium chloride 0.9 % 100 mL IVPB       "And" Linked Group Details   2 g 200 mL/hr over 30 Minutes Intravenous Daily at bedtime 02/10/21 2329 02/13/21 02/12/21  02/10/21 2000  ceFAZolin (ANCEF) IVPB 1 g/50 mL premix  Status:  Discontinued        1 g 100 mL/hr over 30 Minutes Intravenous On call to O.R. 02/10/21 1932 02/10/21 1949   02/10/21 2000  ceFAZolin (ANCEF) IVPB 2g/100 mL premix        2 g 200 mL/hr over 30 Minutes Intravenous On call to O.R. 02/10/21 1949 02/10/21 2021   02/10/21 1630  ceFAZolin (ANCEF) IVPB 2g/100 mL premix        2 g 200 mL/hr over 30 Minutes Intravenous  Once 02/10/21 1603 02/10/21 1809    .  POD/HD#: 110  50 y/o male s/p chainsaw injury to L lower leg with significant soft tissue injury    - complex laceration L ankle due to chainsaw, L flexor tendon disruption at calf and ankle, Laceration of L posterior tibial nerve and artery              NWB L leg x 4 weeks               No ankle ROM x 4 weeks             OOB as tolerated otherwise             Ice and elevate             PT/OT                           Prevena x 7 days then remove                          Cast vs cam boot at follow up   Patient to leave dressing intact until follow-up    - Pain management:             Titrate accordingly              Continue with current regimen    - ABL anemia/Hemodynamics             H&H stabilized     - Medical issues              EtOH use                          CIWA              - DVT/PE prophylaxis:             Lovenox while inpt   No pharmacologics at dc    - ID:              Completed 72 hours of antibiotics   -  Metabolic Bone Disease:             Vitamin d deficiency                          Supplement               - Activity:             As above   - FEN/GI prophylaxis/Foley/Lines:             Reg diet    -Ex-fix/Splint care:             Keep splint clean and dry    - Impediments to fracture  healing/soft tissue healing:             Mechanism of injury              etoh use    - Dispo:             Discharge home today  Follow-up with orthopedics in 1 week  Mearl Latin, PA-C [phone] (C) 02/13/2021, 1:55 PM  [facility] [address] [place] [address] [phone] [phone] (F)    After 5pm and on the weekends please log on to Amion, go to orthopaedics and the look under the [facility] Call for the provider(s) on call. You can also call our office at [phone] and then follow the prompts to be connected to the call team.

## 2021-02-13 NOTE — Discharge Instructions (Signed)
Orthopaedic Trauma Service Discharge Instructions   General Discharge Instructions   WEIGHT BEARING STATUS: Nonweightbearing left leg.  Use walker to assist with ambulation  RANGE OF MOTION/ACTIVITY: Move toes gently, knee range of motion as tolerated.  Do not remove splint.  Activity as tolerated while maintaining weightbearing restrictions  Bone health: Labs show vitamin D deficiency.  Please take supplements that have been prescribed for you  Wound Care: Do not remove splint.  Do not get splint wet.  Contact the office with any questions.  Please be sure to bring your black cam boot with you to your first postoperative appointment  Diet: as you were eating previously.  Can use over the counter stool softeners and bowel preparations, such as Miralax, to help with bowel movements.  Narcotics can be constipating.  Be sure to drink plenty of fluids  PAIN MEDICATION USE AND EXPECTATIONS  You have likely been given narcotic medications to help control your pain.  After a traumatic event that results in an fracture (broken bone) with or without surgery, it is ok to use narcotic pain medications to help control one's pain.  We understand that everyone responds to pain differently and each individual patient will be evaluated on a regular basis for the continued need for narcotic medications. Ideally, narcotic medication use should last no more than 6-8 weeks (coinciding with fracture healing).   As a patient it is your responsibility as well to monitor narcotic medication use and report the amount and frequency you use these medications when you come to your office visit.   We would also advise that if you are using narcotic medications, you should take a dose prior to therapy to maximize you participation.  IF YOU ARE ON NARCOTIC MEDICATIONS IT IS NOT PERMISSIBLE TO OPERATE A MOTOR VEHICLE (MOTORCYCLE/CAR/TRUCK/MOPED) OR HEAVY MACHINERY DO NOT MIX NARCOTICS WITH OTHER CNS (CENTRAL NERVOUS  SYSTEM) DEPRESSANTS SUCH AS ALCOHOL   STOP SMOKING OR USING NICOTINE PRODUCTS!!!!  As discussed nicotine severely impairs your body's ability to heal surgical and traumatic wounds but also impairs bone healing.  Wounds and bone heal by forming microscopic blood vessels (angiogenesis) and nicotine is a vasoconstrictor (essentially, shrinks blood vessels).  Therefore, if vasoconstriction occurs to these microscopic blood vessels they essentially disappear and are unable to deliver necessary nutrients to the healing tissue.  This is one modifiable factor that you can do to dramatically increase your chances of healing your injury.    (This means no smoking, no nicotine gum, patches, etc)     ICE AND ELEVATE INJURED/OPERATIVE EXTREMITY  Using ice and elevating the injured extremity above your heart can help with swelling and pain control.  Icing in a pulsatile fashion, such as 20 minutes on and 20 minutes off, can be followed.    Do not place ice directly on skin. Make sure there is a barrier between to skin and the ice pack.    Using frozen items such as frozen peas works well as the conform nicely to the are that needs to be iced.  USE AN ACE WRAP OR TED HOSE FOR SWELLING CONTROL  In addition to icing and elevation, Ace wraps or TED hose are used to help limit and resolve swelling.  It is recommended to use Ace wraps or TED hose until you are informed to stop.    When using Ace Wraps start the wrapping distally (farthest away from the body) and wrap proximally (closer to the body)   Example: If you  had surgery on your leg or thing and you do not have a splint on, start the ace wrap at the toes and work your way up to the thigh        If you had surgery on your upper extremity and do not have a splint on, start the ace wrap at your fingers and work your way up to the upper arm  IF YOU ARE IN A SPLINT OR CAST DO NOT REMOVE IT FOR ANY REASON   If your splint gets wet for any reason please contact the  office immediately. You may shower in your splint or cast as long as you keep it dry.  This can be done by wrapping in a cast cover or garbage back (or similar)  Do Not stick any thing down your splint or cast such as pencils, money, or hangers to try and scratch yourself with.  If you feel itchy take benadryl as prescribed on the bottle for itching  IF YOU ARE IN A CAM BOOT (BLACK BOOT)  You may remove boot periodically. Perform daily dressing changes as noted below.  Wash the liner of the boot regularly and wear a sock when wearing the boot. It is recommended that you sleep in the boot until told otherwise    Call office for the following:  Temperature greater than 101F  Persistent nausea and vomiting  Severe uncontrolled pain  Redness, tenderness, or signs of infection (pain, swelling, redness, odor or green/yellow discharge around the site)  Difficulty breathing, headache or visual disturbances  Hives  Persistent dizziness or light-headedness  Extreme fatigue  Any other questions or concerns you may have after discharge  In an emergency, call 911 or go to an Emergency Department at a nearby hospital  HELPFUL INFORMATION  ? If you had a block, it will wear off between 8-24 hrs postop typically.  This is period when your pain may go from nearly zero to the pain you would have had postop without the block.  This is an abrupt transition but nothing dangerous is happening.  You may take an extra dose of narcotic when this happens.  ? You should wean off your narcotic medicines as soon as you are able.  Most patients will be off or using minimal narcotics before their first postop appointment.   ? We suggest you use the pain medication the first night prior to going to bed, in order to ease any pain when the anesthesia wears off. You should avoid taking pain medications on an empty stomach as it will make you nauseous.  ? Do not drink alcoholic beverages or take illicit drugs when  taking pain medications.  ? In most states it is against the law to drive while you are in a splint or sling.  And certainly against the law to drive while taking narcotics.  ? You may return to work/school in the next couple of days when you feel up to it.   ? Pain medication may make you constipated.  Below are a few solutions to try in this order: - Decrease the amount of pain medication if you aren't having pain. - Drink lots of decaffeinated fluids. - Drink prune juice and/or each dried prunes  o If the first 3 don't work start with additional solutions - Take Colace - an over-the-counter stool softener - Take Senokot - an over-the-counter laxative - Take Miralax - a stronger over-the-counter laxative     CALL THE OFFICE WITH ANY QUESTIONS OR  CONCERNS: [phone]   VISIT OUR WEBSITE FOR ADDITIONAL INFORMATION: [email or web address]     Cast or Splint Care, Adult Casts and splints are supports that are worn to protect broken bones and other injuries. A cast or splint may hold a bone still and in the correct position while it heals. Casts and splints may also help to ease pain, swelling, and muscle spasms. A cast is a hardened support that is usually made of fiberglass or plaster. It is custom-fit to the body and offers more protection than a splint. Most casts cannot be taken off and put back on. A splint is a type of soft support that is usually made from cloth and elastic. It can be adjusted or taken off as needed. Often, splints are used on broken bones at first. Later, a cast can replace the splint after the swelling goes down. What are the risks? In some cases, wearing a cast or splint can cause a reduced blood supply to the wrist or hand or to the foot and toes. This can happen if there is a lot of swelling or if the cast or splint is too tight. Limited blood supply can cause a problem called compartment syndrome. This can lead to lasting damage. Symptoms include:  Pain that  is getting worse.  Numbness and tingling.  Changes in skin color, including paleness or a bluish color.  Cold fingers or toes. Other problems from wearing a cast or splint can include:  Skin irritation that can cause: ? Itching. ? Rash. ? Skin sores. ? Skin infection.  Limb stiffness or weakness. How to care for your cast  Check the skin around it every day. Tell your doctor about any concerns.  Do not stick anything inside it to scratch your skin.  You may put lotion on dry skin around the edges of the cast. Do not put lotion on the skin underneath it.  Keep it clean and dry.   How to care for your splint  Wear the splint as told by your doctor. Take it off only as told by your doctor.  Check the skin around it every day. Tell your doctor about any concerns.  Loosen it if your fingers or toes tingle, get numb, or turn cold and blue.  Keep it clean and dry. Clean your splint as told by your doctor. Use mild soap and water and let it air-dry. Do not use heat on the splint. Follow these instructions at home: Bathing  Do not take baths, swim, or use a hot tub until your doctor approves. Ask your doctor if you may take showers. You may only be allowed to take sponge baths.  If the cast or splint is not waterproof: ? Do not let it get wet. ? Cover it with a watertight covering when you take a bath or a shower. Managing pain, stiffness, and swelling  If told, put ice on the affected area. To do this: ? If you have a cast or splint that can be taken off, take it off as told by your doctor. ? Put ice in a plastic bag. ? Place a towel between your skin and the bag or between your cast and the bag. ? Leave the ice on for 20 minutes, 2-3 times a day.  Move your fingers or toes often.  Raise (elevate) the injured area above the level of your heart while you are sitting or lying down.   Safety  Do not use your injured leg or foot to  support your body weight until your doctor  says that you can.  Use crutches or other helpful (assistive) devices as told by your doctor.  Ask your doctor when it is safe to drive if you have a cast or splint on part of your body. General instructions  Do not put pressure on any part of the cast or splint until it is fully hardened. This may take many hours.  Take over-the-counter and prescription medicines only as told by your doctor.  Do not use any products that contain nicotine or tobacco, such as cigarettes, e-cigarettes, and chewing tobacco. These can delay healing. If you need help quitting, ask your doctor.  Return to your normal activities as told by your doctor. Ask your doctor what activities are safe for you.  Keep all follow-up visits as told by your doctor. This is important. Contact a doctor if:  The skin around the cast or splint gets red or raw.  The skin under the cast is very itchy or painful.  Your cast or splint: ? Gets damaged. ? Feels very uncomfortable. ? Is too tight or too loose.  Your cast becomes wet or it starts to have a soft spot or area.  There is a bad smell coming from under your cast.  You get an object stuck under your cast. Get help right away if:  You get any symptoms of compartment syndrome, such as: ? Very bad pain or pressure under the cast. ? Numbness, tingling, coldness, or pale or bluish skin.  The part of your body above or below the cast is swollen, and it turns a different color (is discolored).  You cannot feel or move your fingers or toes.  Your pain gets worse.  There is fluid leaking through the cast.  You have trouble breathing or shortness of breath.  You have chest pain. Summary  Casts and splints are worn to protect broken bones and other injuries.  Most casts cannot be taken off, and most splints can be taken off.  Keep your cast or splint clean and dry.  Take off your cast or splint only as told by your doctor.  Get help right away if you have  very bad pain, numbness, tingling, or skin that turns cold or another color. This information is not intended to replace advice given to you by your health care provider. Make sure you discuss any questions you have with your health care provider. Document Revised: 07/27/2019 Document Reviewed: 07/27/2019 Elsevier Patient Education  2021 [employer].

## 2021-02-13 NOTE — Plan of Care (Signed)
  Problem: Pain Managment: Goal: General experience of comfort will improve Outcome: Progressing   Problem: Skin Integrity: Goal: Risk for impaired skin integrity will decrease Outcome: Progressing   

## 2021-02-13 NOTE — Discharge Summary (Signed)
Orthopaedic Trauma Service (OTS) Discharge Summary   Patient ID: Charles Hicks MRN: [id number] DOB/AGE: Jun 20, 1971 50 y.o.  Admit date: 02/10/2021 Discharge date: 02/13/2021  Admission Diagnoses: Complex wounds left ankle due to chainsaw Open left medial malleolus fracture Injury due to chainsaw   Discharge Diagnoses:  Principal Problem:   Open fracture of malleolus of left ankle Active Problems:   Status post surgery   Vitamin D deficiency   Acute blood loss anemia   Contact with chainsaw as cause of accidental injury   Other injury of muscle and tendon of long flexor muscle of toe at ankle and foot level, left foot, initial encounter   Posterior tibial nerve injury, left, initial encounter   Posterior tibial artery injury, left, initial encounter   Past Medical History:  Diagnosis Date  . Posterior tibial artery injury, left, initial encounter 02/13/2021  . Vitamin D deficiency 02/13/2021     Procedures Performed: 02/10/2021-Dr. Handy 1.  Irrigation and debridement of open fracture including removal of bone. 2.  Exploration of penetrating wound, left calf and ankle. 3.  Repair of left flexor tendon, left posterior tibial tendon tear. 4.  Repair of FDP tear. 5.  Repair of medial retinaculum. 6.  Complex repair of 15 cm wound. 7.  Application of small wound VAC.   Discharged Condition: good  Hospital Course:   50 year old male admitted to [facility] on 02/10/2021 after sustaining an injury to his left ankle and lower leg with a chainsaw.  Patient was seen in the emergency department by vascular surgery as well where the proximal limb of his posterior tibial artery was identified and tied off.  Due to the complexity of his wounds and significant soft tissue involvement we did take him to the OR for formal irrigation debridement.  Patient was found to have complex injury.  Extent of his injuries noted above in the procedures.  After surgery patient was  transferred to the PACU for recovery from anesthesia and then transferred to the orthopedic floor for observation, pain control and therapies.  He did not have any issues during his hospital stay.  His admission was really to receive appropriate IV antibiotics given the dirty nature of his wound.  Patient progressed well over the next several days and was deemed to be stable for discharge on 02/13/2021.  Prior to discharge his hospital wound VAC was converted over to the portable [facility] unit.  He will follow-up with orthopedics in 1 week for removal of his splint and then conversion to either a cam boot or a short leg cast.  Patient will remain nonweightbearing on his left leg for the time being.  He would likely be nonweightbearing for about 4weeks to allow for soft tissue healing to occur and do not tear his flexor tendon repairs.  He will not be discharged on any pharmacologic DVT prophylaxis as he is ambulatory.  Labs did show some vitamin D deficiency and he is started on supplementation for this along with some vitamin C.  At the time of discharge patient was tolerating a diet, mobilizing well with a walker.  Patient does drink a sixpack a day.  Despite this he does not show any signs or symptoms of withdrawal.  Withdrawal protocol was ordered postoperatively however patient never required any doses of Ativan    Consults: vascular surgery  Significant Diagnostic Studies: labs:   Results for Charles, Hicks (MRN [id number]) as of 02/13/2021 13:30  Ref. Range 02/11/2021 00:32 02/11/2021 08:05 02/11/2021  11:04 02/12/2021 04:07 02/12/2021 13:52  COMPREHENSIVE METABOLIC PANEL Unknown  Rpt (A)     Sodium Latest Ref Range: 135 - 145 mmol/L  137     Potassium Latest Ref Range: 3.5 - 5.1 mmol/L  4.2     Chloride Latest Ref Range: 98 - 111 mmol/L  104     CO2 Latest Ref Range: 22 - 32 mmol/L  26     Glucose Latest Ref Range: 70 - 99 mg/dL  161 (H)     BUN Latest Ref Range: 6 - 20 mg/dL  8     Creatinine  Latest Ref Range: 0.61 - 1.24 mg/dL  0.96     Calcium Latest Ref Range: 8.9 - 10.3 mg/dL  8.3 (L)     Anion gap Latest Ref Range: 5 - 15   7     Phosphorus Latest Ref Range: 2.5 - 4.6 mg/dL 3.1      Magnesium Latest Ref Range: 1.7 - 2.4 mg/dL 1.5 (L)   2.2   Alkaline Phosphatase Latest Ref Range: 38 - 126 U/L  24 (L)     Albumin Latest Ref Range: 3.5 - 5.0 g/dL  3.5     AST Latest Ref Range: 15 - 41 U/L  28     ALT Latest Ref Range: 0 - 44 U/L  19     Total Protein Latest Ref Range: 6.5 - 8.1 g/dL  5.9 (L)     Total Bilirubin Latest Ref Range: 0.3 - 1.2 mg/dL  0.9     PREALBUMIN Latest Ref Range: 18 - 38 mg/dL    04.5   GFR, Estimated Latest Ref Range: >60 mL/min  >60     Vitamin D, 25-Hydroxy Latest Ref Range: 30 - 100 ng/mL 12.81 (L)      WBC Latest Ref Range: 4.0 - 10.5 K/uL 10.1   6.7 8.9  RBC Latest Ref Range: 4.22 - 5.81 MIL/uL 3.07 (L)   2.45 (L) 2.65 (L)  Hemoglobin Latest Ref Range: 13.0 - 17.0 g/dL 9.2 (L)   7.4 (L) 7.8 (L)  HCT Latest Ref Range: 39.0 - 52.0 % 27.1 (L)   21.5 (L) 23.7 (L)  MCV Latest Ref Range: 80.0 - 100.0 fL 88.3   87.8 89.4  MCH Latest Ref Range: 26.0 - 34.0 pg 30.0   30.2 29.4  MCHC Latest Ref Range: 30.0 - 36.0 g/dL 40.9   81.1 91.4  RDW Latest Ref Range: 11.5 - 15.5 % 15.0   15.2 15.2  Platelets Latest Ref Range: 150 - 400 K/uL 156   123 (L) 143 (L)  nRBC Latest Ref Range: 0.0 - 0.2 % 0.0   0.0 0.0  URINALYSIS, ROUTINE W REFLEX MICROSCOPIC Unknown   Rpt (A)    Appearance Latest Ref Range: CLEAR    CLEAR    Bilirubin Urine Latest Ref Range: NEGATIVE    NEGATIVE    Color, Urine Latest Ref Range: YELLOW    STRAW (A)    Glucose, UA Latest Ref Range: NEGATIVE mg/dL   NEGATIVE    Hgb urine dipstick Latest Ref Range: NEGATIVE    NEGATIVE    Ketones, ur Latest Ref Range: NEGATIVE mg/dL   NEGATIVE    Leukocytes,Ua Latest Ref Range: NEGATIVE    NEGATIVE    Nitrite Latest Ref Range: NEGATIVE    NEGATIVE    pH Latest Ref Range: 5.0 - 8.0    5.0    Protein Latest  Ref Range: NEGATIVE mg/dL   NEGATIVE  Specific Gravity, Urine Latest Ref Range: 1.005 - 1.030    1.011      Treatments: IV hydration, antibiotics: ceftriaxone and metronidazole, analgesia: acetaminophen, Dilaudid and OxyIR, anticoagulation: LMW heparin, therapies: PT, OT and RN and surgery: As above  Discharge Exam:                                 Orthopaedic Trauma Service Progress Note   Patient ID: Charles Hicks MRN: [id number] DOB/AGE: [birth year] 49 y.o.   Subjective:   Doing well Pain controlled Ready to go home   Again reviewed Prevena set up with patient.  Provide him with an additional 2 canisters for drainage   H/H improved   No chest pain, dizziness, lightheadedness   ROS Above   Objective:    VITALS:         Vitals:    02/12/21 2019 02/13/21 0342 02/13/21 0751 02/13/21 0800  BP: 130/86 118/71 116/63    Pulse: 85 80 88    Resp: Temp: 98.8 F (37.1 C) 98 F (36.7 C) 98.5 F (36.9 C)    TempSrc: Oral   Oral    SpO2: 99% 100% 100%    Weight:          Height:              Estimated body mass index is 26.63 kg/m as calculated from the following:   Height as of this encounter:  (1.676 m).   Weight as of this encounter: 74.8 kg.     Intake/Output      03/23 0701 03/24 0700 03/24 0701 03/25 0700   I.V. (mL/kg)     IV Piggyback     Total Intake(mL/kg)     Urine (mL/kg/hr) 650 (0.4)    Drains 250    Stool 1    Total Output 901    Net -901         Urine Occurrence 1 x       LABS   Lab Results Last 24 Hours  No results found for this or any previous visit (from the past 24 hour(s)).       PHYSICAL EXAM:      Gen: awake, sitting up in chair, NAD, appears well Lungs: unlabored Cardiac: Reg Abd: + BS, NTND Ext:       Left Lower Extremity              Splint fitting well              Ext warm              Minimal swelling                       Vac with good seal              DPN, SPN sensation intact              TN sensation markedly diminished              Did not have pt perform motor functions due to muscle repair                Transitioned over to [facility] unit without difficulty. Hooked power cord up as well to charge unit      Assessment/Plan: 3 Days Post-Op    Active Problems:   Status post  surgery   Open fracture of malleolus of left ankle                Anti-infectives (From admission, onward)      Start     Dose/Rate Route Frequency Ordered Stop    02/11/21 0000   metroNIDAZOLE (FLAGYL) IVPB 500 mg       "And" Linked Group Details   500 mg 100 mL/hr over 60 Minutes Intravenous Every 8 hours 02/10/21 2329 02/13/21 2359    02/10/21 2345   cefTRIAXone (ROCEPHIN) 2 g in sodium chloride 0.9 % 100 mL IVPB       "And" Linked Group Details   2 g 200 mL/hr over 30 Minutes Intravenous Daily at bedtime 02/10/21 2329 02/13/21 0721    02/10/21 2000   ceFAZolin (ANCEF) IVPB 1 g/50 mL premix  Status:  Discontinued        1 g 100 mL/hr over 30 Minutes Intravenous On call to O.R. 02/10/21 1932 02/10/21 1949    02/10/21 2000   ceFAZolin (ANCEF) IVPB 2g/100 mL premix        2 g 200 mL/hr over 30 Minutes Intravenous On call to O.R. 02/10/21 1949 02/10/21 2021    02/10/21 1630   ceFAZolin (ANCEF) IVPB 2g/100 mL premix        2 g 200 mL/hr over 30 Minutes Intravenous  Once 02/10/21 1603 02/10/21 1809       .   POD/HD#: 38   50 y/o male s/p chainsaw injury to L lower leg with significant soft tissue injury    - complex laceration L ankle due to chainsaw, L flexor tendon disruption at calf and ankle, Laceration of L posterior tibial nerve and artery              NWB L leg x 4 weeks               No ankle ROM x 4 weeks             OOB as tolerated otherwise             Ice and elevate             PT/OT                           Prevena x 7 days then remove                          Cast vs cam boot at follow up               Patient to leave dressing intact until follow-up     -  Pain management:             Titrate accordingly              Continue with current regimen    - ABL anemia/Hemodynamics             H&H stabilized                - Medical issues              EtOH use                          CIWA              - DVT/PE prophylaxis:  Lovenox while inpt              No pharmacologics at dc    - ID:              Completed 72 hours of antibiotics   - Metabolic Bone Disease:             Vitamin d deficiency                          Supplement               - Activity:             As above   - FEN/GI prophylaxis/Foley/Lines:             Reg diet    -Ex-fix/Splint care:             Keep splint clean and dry    - Impediments to fracture healing/soft tissue healing:             Mechanism of injury              etoh use    - Dispo:             Discharge home today             Follow-up with orthopedics in 1 week   Disposition: Discharge disposition: 01-Home or Self Care       Discharge Instructions    Call MD / Call 911   Complete by: As directed    If you experience chest pain or shortness of breath, CALL 911 and be transported to the hospital emergency room.  If you develope a fever above 101 F, pus (white drainage) or increased drainage or redness at the wound, or calf pain, call your surgeon's office.   Constipation Prevention   Complete by: As directed    Drink plenty of fluids.  Prune juice may be helpful.  You may use a stool softener, such as Colace (over the counter) 100 mg twice a day.  Use MiraLax (over the counter) for constipation as needed.   Diet general   Complete by: As directed    Discharge instructions   Complete by: As directed    Orthopaedic Trauma Service Discharge Instructions   General Discharge Instructions   WEIGHT BEARING STATUS: Nonweightbearing left leg.  Use walker to assist with ambulation  RANGE OF MOTION/ACTIVITY: Move toes gently, knee range of motion as tolerated.  Do not remove  splint.  Activity as tolerated while maintaining weightbearing restrictions  Bone health: Labs show vitamin D deficiency.  Please take supplements that have been prescribed for you  Wound Care: Do not remove splint.  Do not get splint wet.  Contact the office with any questions.  Please be sure to bring your black cam boot with you to your first postoperative appointment  Diet: as you were eating previously.  Can use over the counter stool softeners and bowel preparations, such as Miralax, to help with bowel movements.  Narcotics can be constipating.  Be sure to drink plenty of fluids  PAIN MEDICATION USE AND EXPECTATIONS  You have likely been given narcotic medications to help control your pain.  After a traumatic event that results in an fracture (broken bone) with or without surgery, it is ok to use narcotic pain medications to help control one's pain.  We understand that everyone responds to pain differently and each individual  patient will be evaluated on a regular basis for the continued need for narcotic medications. Ideally, narcotic medication use should last no more than 6-8 weeks (coinciding with fracture healing).   As a patient it is your responsibility as well to monitor narcotic medication use and report the amount and frequency you use these medications when you come to your office visit.   We would also advise that if you are using narcotic medications, you should take a dose prior to therapy to maximize you participation.  IF YOU ARE ON NARCOTIC MEDICATIONS IT IS NOT PERMISSIBLE TO OPERATE A MOTOR VEHICLE (MOTORCYCLE/CAR/TRUCK/MOPED) OR HEAVY MACHINERY DO NOT MIX NARCOTICS WITH OTHER CNS (CENTRAL NERVOUS SYSTEM) DEPRESSANTS SUCH AS ALCOHOL   STOP SMOKING OR USING NICOTINE PRODUCTS!!!!  As discussed nicotine severely impairs your body's ability to heal surgical and traumatic wounds but also impairs bone healing.  Wounds and bone heal by forming microscopic blood vessels  (angiogenesis) and nicotine is a vasoconstrictor (essentially, shrinks blood vessels).  Therefore, if vasoconstriction occurs to these microscopic blood vessels they essentially disappear and are unable to deliver necessary nutrients to the healing tissue.  This is one modifiable factor that you can do to dramatically increase your chances of healing your injury.    (This means no smoking, no nicotine gum, patches, etc)     ICE AND ELEVATE INJURED/OPERATIVE EXTREMITY  Using ice and elevating the injured extremity above your heart can help with swelling and pain control.  Icing in a pulsatile fashion, such as 20 minutes on and 20 minutes off, can be followed.    Do not place ice directly on skin. Make sure there is a barrier between to skin and the ice pack.    Using frozen items such as frozen peas works well as the conform nicely to the are that needs to be iced.  USE AN ACE WRAP OR TED HOSE FOR SWELLING CONTROL  In addition to icing and elevation, Ace wraps or TED hose are used to help limit and resolve swelling.  It is recommended to use Ace wraps or TED hose until you are informed to stop.    When using Ace Wraps start the wrapping distally (farthest away from the body) and wrap proximally (closer to the body)   Example: If you had surgery on your leg or thing and you do not have a splint on, start the ace wrap at the toes and work your way up to the thigh        If you had surgery on your upper extremity and do not have a splint on, start the ace wrap at your fingers and work your way up to the upper arm  IF YOU ARE IN A SPLINT OR CAST DO NOT REMOVE IT FOR ANY REASON   If your splint gets wet for any reason please contact the office immediately. You may shower in your splint or cast as long as you keep it dry.  This can be done by wrapping in a cast cover or garbage back (or similar)  Do Not stick any thing down your splint or cast such as pencils, money, or hangers to try and scratch yourself  with.  If you feel itchy take benadryl as prescribed on the bottle for itching  IF YOU ARE IN A CAM BOOT (BLACK BOOT)  You may remove boot periodically. Perform daily dressing changes as noted below.  Wash the liner of the boot regularly and wear a sock when wearing the  boot. It is recommended that you sleep in the boot until told otherwise    Call office for the following: Temperature greater than 101F Persistent nausea and vomiting Severe uncontrolled pain Redness, tenderness, or signs of infection (pain, swelling, redness, odor or green/yellow discharge around the site) Difficulty breathing, headache or visual disturbances Hives Persistent dizziness or light-headedness Extreme fatigue Any other questions or concerns you may have after discharge  In an emergency, call 911 or go to an Emergency Department at a nearby hospital  HELPFUL INFORMATION  If you had a block, it will wear off between 8-24 hrs postop typically.  This is period when your pain may go from nearly zero to the pain you would have had postop without the block.  This is an abrupt transition but nothing dangerous is happening.  You may take an extra dose of narcotic when this happens.  You should wean off your narcotic medicines as soon as you are able.  Most patients will be off or using minimal narcotics before their first postop appointment.   We suggest you use the pain medication the first night prior to going to bed, in order to ease any pain when the anesthesia wears off. You should avoid taking pain medications on an empty stomach as it will make you nauseous.  Do not drink alcoholic beverages or take illicit drugs when taking pain medications.  In most states it is against the law to drive while you are in a splint or sling.  And certainly against the law to drive while taking narcotics.  You may return to work/school in the next couple of days when you feel up to it.   Pain medication may make you  constipated.  Below are a few solutions to try in this order: Decrease the amount of pain medication if you aren't having pain. Drink lots of decaffeinated fluids. Drink prune juice and/or each dried prunes  If the first 3 don't work start with additional solutions Take Colace - an over-the-counter stool softener Take Senokot - an over-the-counter laxative Take Miralax - a stronger over-the-counter laxative     CALL THE OFFICE WITH ANY QUESTIONS OR CONCERNS: [phone]   VISIT OUR WEBSITE FOR ADDITIONAL INFORMATION: [email or web address]   Driving restrictions   Complete by: As directed    No driving until further notice   Increase activity slowly as tolerated   Complete by: As directed    Non weight bearing   Complete by: As directed    Laterality: left   Extremity: Lower     Allergies as of 02/13/2021   No Known Allergies     Medication List    TAKE these medications   acetaminophen 500 MG tablet Commonly known as: TYLENOL Take 1 tablet (500 mg total) by mouth every 8 (eight) hours for 10 days.   ascorbic acid 500 MG tablet Commonly known as: VITAMIN C Take 1 tablet (500 mg total) by mouth daily. Start taking on: February 14, 2021   docusate sodium 100 MG capsule Commonly known as: COLACE Take 1 capsule (100 mg total) by mouth 2 (two) times daily.   ferrous sulfate 325 (65 FE) MG tablet Take 1 tablet (325 mg total) by mouth 3 (three) times daily with meals.   ketorolac 10 MG tablet Commonly known as: TORADOL Take 1 tablet (10 mg total) by mouth every 6 (six) hours as needed for moderate pain.   methocarbamol 500 MG tablet Commonly known as: ROBAXIN Take 1 tablet (500 mg total)  by mouth every 8 (eight) hours as needed for muscle spasms.   oxyCODONE-acetaminophen 5-325 MG tablet Commonly known as: Percocet Take 1-2 tablets by mouth every 8 (eight) hours as needed for severe pain.   Vitamin D 125 MCG (5000 UT) Caps Take 1 capsule by mouth daily.             Durable Medical Equipment  (From admission, onward)         Start     Ordered   02/13/21 1344  For home use only DME Walker rolling  Once       Question Answer Comment  Walker: With 5 Inch Wheels   Patient needs a walker to treat with the following condition Open fracture of malleolus of left ankle      02/13/21 1343           Discharge Care Instructions  (From admission, onward)         Start     Ordered   02/13/21 0000  Non weight bearing       Question Answer Comment  Laterality left   Extremity Lower      02/13/21 1412          Follow-up Information    Myrene GalasHandy, Michael, MD. Schedule an appointment as soon as possible for a visit on 02/19/2021.   Specialty: Orthopedic Surgery Contact information: [address] [place] [address] [phone]               Discharge Instructions and Plan:  50 year old male accidental chainsaw injury left leg with complex injuries including muscle/tendon, bone, artery nerve  Weightbearing: NWB LLE Insicional and dressing care: Dressings left intact until follow-up Orthopedic device(s): Wound OZH:[id number]:Prevena and Splint Showering: Okay to shower but keep splint dry VTE prophylaxis: No pharmacologics . Pain control: Tylenol, Robaxin, Toradol, Percocet Bone Health/Optimization: Vitamin D3 5000 IUs daily, vitamin C 1000 mg daily Follow - up plan: 1 week Contact information:  Myrene GalasMichael Handy MD, Montez MoritaKeith Quoc Tome PA-C   Signed:  Mearl LatinKeith W. Shantele Reller, PA-C [phone] Salena Saner(C) 02/13/2021, 2:14 PM  [facility] [address] [place] [address] [phone] Collier Bullock(O) [phone] (F)

## 2021-02-13 NOTE — Progress Notes (Signed)
Occupational Therapy Treatment Patient Details Name: TADEN WITTER MRN: [id number] DOB: [birth year] Today's Date: 02/13/2021    History of present illness Illias Pantano is 50 year old male who sustained an accidental chainsaw injury to left distal medial leg/ankle with complex wound including tendon, nerve and vascular injury. Pt appears to have injury to his posterior tibial nerve as well as his flexor digitorum longus, posterior tibial artery was ligated at bedside by vascular surgery. Pt underwent and I&D of L ankle and was placed in a splint on 3/21 and will be NWBx4 weeks. PMH: unremarkable.   OT comments  Pt in bathroom with PT upon arrival, agreeable to OT session. Pt completed oral care while seated at sink level demonstrating good use of compensatory strategies during ADL. Pt requires supervision for ADL completion and for functional mobility at RW level. Pt continues to demonstrate great adherence to NWB precautions. Pt with no further acute OT needs identified. All education has been completed and the patient has no further questions. See below for any follow-up Occupational Therapy or equipment needs. OT to sign off. Thank you for referral.    Follow Up Recommendations  No OT follow up;Supervision - Intermittent    Equipment Recommendations  Other (comment) (reacher)    Recommendations for Other Services      Precautions / Restrictions Precautions Precautions: Fall Precaution Comments: L LE wound vac, splint with ace wrap Required Braces or Orthoses: Other Brace Other Brace: pt given CAM boot but is to take home and bring with him to his follow up with Dr. Marcelino Scot next week, pt instructed not to put CAM boot on over splint Restrictions Weight Bearing Restrictions: Yes LLE Weight Bearing: Non weight bearing       Mobility Bed Mobility               General bed mobility comments: pt in bathroom upon arrival, returned to recliner at end of session     Transfers Overall transfer level: Needs assistance Equipment used: Rolling walker (2 wheeled) Transfers: Sit to/from Stand Sit to Stand: Supervision         General transfer comment: pt able to maintain L LE NWB, good hand placement,    Balance Overall balance assessment: Needs assistance Sitting-balance support: Feet supported;No upper extremity supported Sitting balance-Leahy Scale: Good     Standing balance support: Single extremity supported Standing balance-Leahy Scale: Poor Standing balance comment: reliant on at least single UE support                           ADL either performed or assessed with clinical judgement   ADL Overall ADL's : Needs assistance/impaired Eating/Feeding: Independent;Sitting   Grooming: Wash/dry hands;Wash/dry face;Oral care;Modified independent;Sitting Grooming Details (indicate cue type and reason): at sink level Upper Body Bathing: Set up;Sitting           Lower Body Dressing: Sitting/lateral leans;Sit to/from stand;Supervision/safety   Toilet Transfer: Ambulation;RW;Comfort height toilet;Grab bars;Cueing for safety;Supervision/safety   Toileting- Clothing Manipulation and Hygiene: Sit to/from stand;Supervision/safety       Functional mobility during ADLs: Rolling walker;Cueing for safety;Supervision/safety General ADL Comments: continued to reinforce compensatory LB ADL techniques     Vision       Perception     Praxis      Cognition Arousal/Alertness: Awake/alert Behavior During Therapy: WFL for tasks assessed/performed Overall Cognitive Status: Within Functional Limits for tasks assessed  Exercises     Shoulder Instructions       General Comments vss    Pertinent Vitals/ Pain       Pain Assessment: 0-10 Pain Score: 2  Pain Location: RLE Pain Descriptors / Indicators: Sore Pain Intervention(s): Monitored during session;Limited activity  within patient's tolerance  Home Living                                          Prior Functioning/Environment              Frequency  Min 2X/week        Progress Toward Goals  OT Goals(current goals can now be found in the care plan section)  Progress towards OT goals: Goals met/education completed, patient discharged from OT  Acute Rehab OT Goals Patient Stated Goal: go home OT Goal Formulation: With patient Time For Goal Achievement: 02/25/21 Potential to Achieve Goals: Good ADL Goals Pt Will Perform Grooming: with supervision;with set-up;with modified independence;standing Pt Will Perform Lower Body Bathing: with supervision;with set-up;with modified independence;sitting/lateral leans;sit to/from stand Pt Will Perform Lower Body Dressing: with supervision;with set-up;with modified independence;sitting/lateral leans;sit to/from stand Pt Will Transfer to Toilet: with supervision;with modified independence;ambulating Pt Will Perform Toileting - Clothing Manipulation and hygiene: with supervision;with modified independence;sit to/from stand  Plan Discharge plan remains appropriate;All goals met and education completed, patient discharged from OT services    Co-evaluation                 AM-PAC OT "6 Clicks" Daily Activity     Outcome Measure   Help from another person eating meals?: None Help from another person taking care of personal grooming?: A Little Help from another person toileting, which includes using toliet, bedpan, or urinal?: A Little Help from another person bathing (including washing, rinsing, drying)?: A Little Help from another person to put on and taking off regular upper body clothing?: None Help from another person to put on and taking off regular lower body clothing?: A Little 6 Click Score: 20    End of Session Equipment Utilized During Treatment: Gait belt;Rolling walker  OT Visit Diagnosis: Unsteadiness on feet  (R26.81);Other abnormalities of gait and mobility (R26.89);Pain Pain - Right/Left: Left Pain - part of body: Leg;Ankle and joints of foot   Activity Tolerance Patient tolerated treatment well   Patient Left in chair;with call bell/phone within reach;with chair alarm set   Nurse Communication Mobility status        Time: 6203-5597 OT Time Calculation (min): 13 min  Charges: OT General Charges $OT Visit: 1 Visit OT Treatments $Self Care/Home Management : 8-22 mins  Helene Kelp OTR/L Acute Rehabilitation Services Office: [phone]    Wyn Forster 02/13/2021, 12:59 PM

## 2021-02-25 ENCOUNTER — Other Ambulatory Visit (HOSPITAL_COMMUNITY): Payer: Self-pay

## 2022-01-11 IMAGING — DX DG ANKLE 2V *L*
2 series · 2 of 2 positions shown · non-contrast
Comparison: None

CLINICAL DATA: Trauma, chainsaw laceration to the LEFT ankle.

EXAM:
LEFT ANKLE - 2 VIEW

[ankle ap]
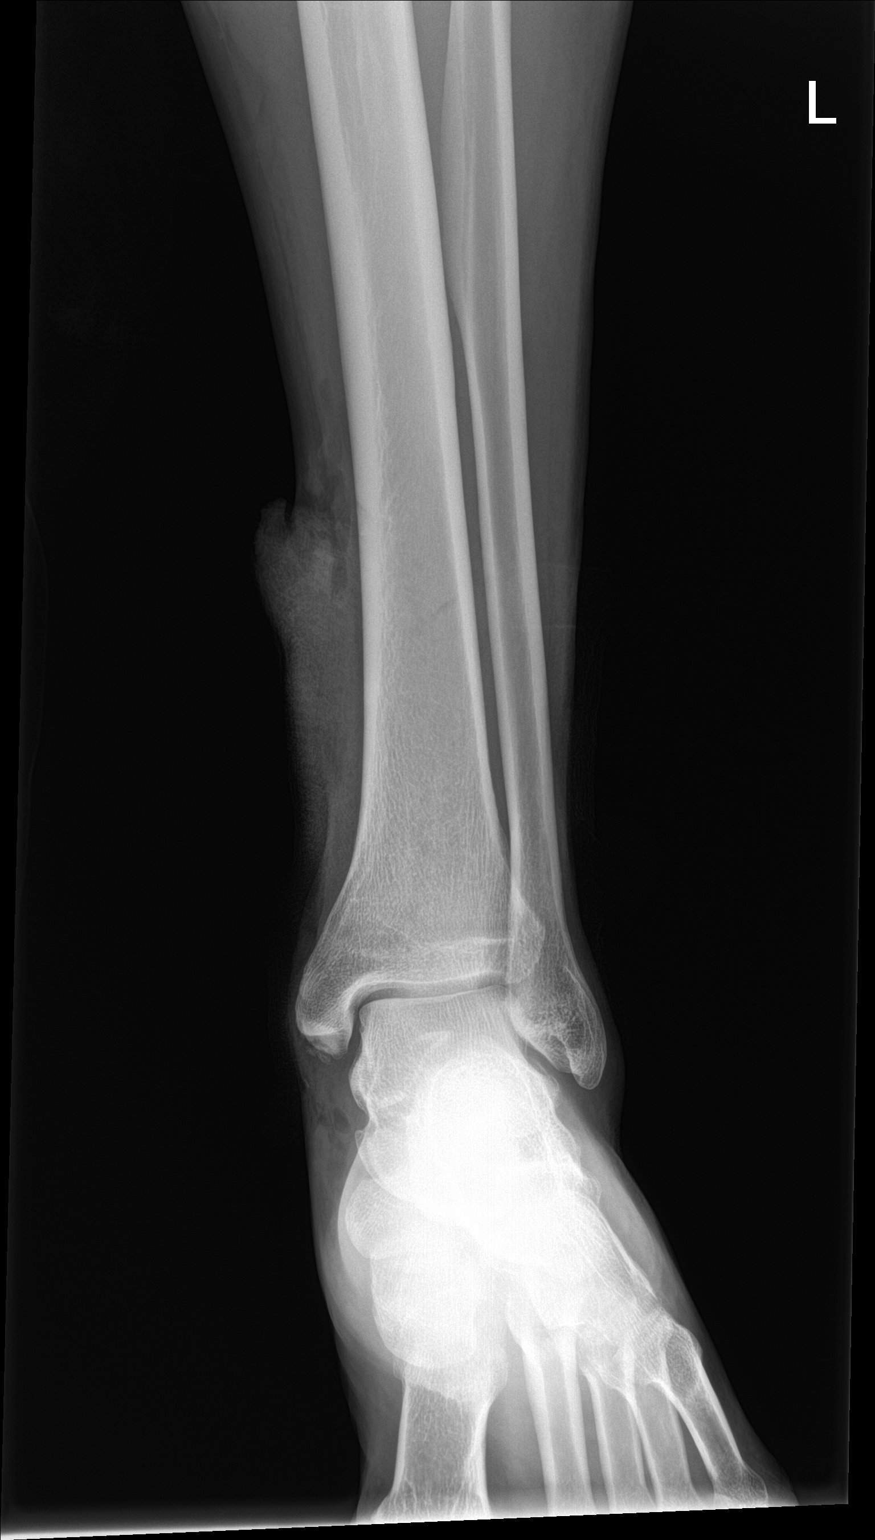

[ankle lat]
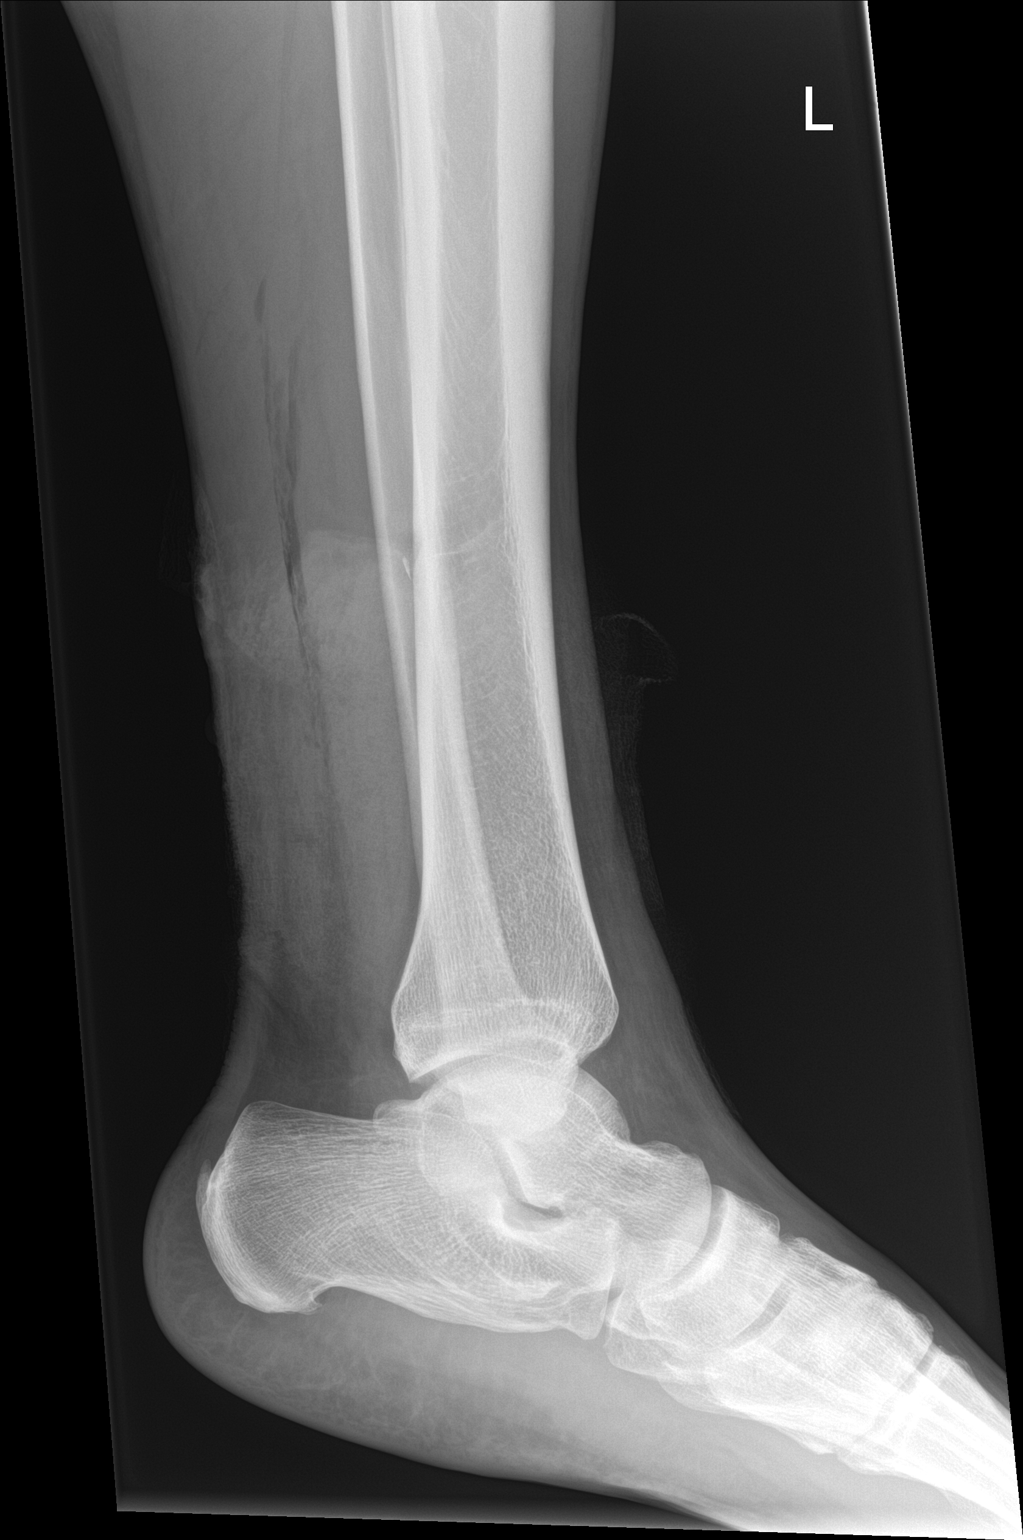

[2 of 2 positions shown; findings below may reference images not displayed]

FINDINGS: Soft tissue defect in the medial aspect of the LEFT lower medial
extremity overlying the distal tibia. Gas in the soft tissues
compatible with reported history of posttraumatic soft tissue
injury. No radiopaque foreign body. No signs of fracture. Lateral
view shows thickening of the myotendinous junction of the Achilles
and also shows gas in the soft tissues. Dressing in place over the
site.

On the lateral view posterior to the tibia at the site of injury
there is a small calcific density approximately 3 mm. Bones and soft
tissues are otherwise unremarkable.
IMPRESSION: 1. Soft tissue defect with gas in the soft tissues compatible with
reported history of posttraumatic soft tissue injury.
2. Question of small bone fragment versus is small foreign body
projecting posterior to the tibia on the lateral view.
3. No sign of complete fracture or displacement of the tibia or
fibula.
4. Thickening of the myotendinous junction/upper Achilles may simply
be related to overlapping soft tissues and edema in the lower
extremity. Correlate with site of injury to determine whether they
may be risk for Achilles injury.
